# Patient Record
Sex: Male | Born: 1945 | Race: Black or African American | Hispanic: No | Marital: Married | Smoking: Former smoker
Health system: Southern US, Community
[De-identification: ages and names within clinical notes are randomized; demographics above are authoritative.]

## PROBLEM LIST (undated history)

## (undated) DIAGNOSIS — R011 Cardiac murmur, unspecified: Secondary | ICD-10-CM

## (undated) DIAGNOSIS — I509 Heart failure, unspecified: Secondary | ICD-10-CM

## (undated) DIAGNOSIS — Z5189 Encounter for other specified aftercare: Secondary | ICD-10-CM

## (undated) DIAGNOSIS — E079 Disorder of thyroid, unspecified: Secondary | ICD-10-CM

## (undated) DIAGNOSIS — IMO0002 Reserved for concepts with insufficient information to code with codable children: Secondary | ICD-10-CM

## (undated) DIAGNOSIS — Z860100 Personal history of colon polyps, unspecified: Secondary | ICD-10-CM

## (undated) DIAGNOSIS — J40 Bronchitis, not specified as acute or chronic: Secondary | ICD-10-CM

## (undated) DIAGNOSIS — R600 Localized edema: Secondary | ICD-10-CM

## (undated) DIAGNOSIS — Z940 Kidney transplant status: Secondary | ICD-10-CM

## (undated) DIAGNOSIS — N189 Chronic kidney disease, unspecified: Secondary | ICD-10-CM

## (undated) DIAGNOSIS — I2510 Atherosclerotic heart disease of native coronary artery without angina pectoris: Secondary | ICD-10-CM

## (undated) DIAGNOSIS — R609 Edema, unspecified: Secondary | ICD-10-CM

## (undated) DIAGNOSIS — I499 Cardiac arrhythmia, unspecified: Secondary | ICD-10-CM

## (undated) DIAGNOSIS — B958 Unspecified staphylococcus as the cause of diseases classified elsewhere: Secondary | ICD-10-CM

## (undated) DIAGNOSIS — M1990 Unspecified osteoarthritis, unspecified site: Secondary | ICD-10-CM

## (undated) DIAGNOSIS — Z8719 Personal history of other diseases of the digestive system: Secondary | ICD-10-CM

## (undated) DIAGNOSIS — R3915 Urgency of urination: Secondary | ICD-10-CM

## (undated) DIAGNOSIS — E669 Obesity, unspecified: Secondary | ICD-10-CM

## (undated) DIAGNOSIS — D649 Anemia, unspecified: Secondary | ICD-10-CM

## (undated) DIAGNOSIS — E785 Hyperlipidemia, unspecified: Secondary | ICD-10-CM

## (undated) DIAGNOSIS — R350 Frequency of micturition: Secondary | ICD-10-CM

## (undated) DIAGNOSIS — R351 Nocturia: Secondary | ICD-10-CM

## (undated) DIAGNOSIS — IMO0001 Reserved for inherently not codable concepts without codable children: Secondary | ICD-10-CM

## (undated) DIAGNOSIS — I10 Essential (primary) hypertension: Secondary | ICD-10-CM

## (undated) DIAGNOSIS — L853 Xerosis cutis: Secondary | ICD-10-CM

## (undated) DIAGNOSIS — K219 Gastro-esophageal reflux disease without esophagitis: Secondary | ICD-10-CM

## (undated) DIAGNOSIS — G4733 Obstructive sleep apnea (adult) (pediatric): Secondary | ICD-10-CM

## (undated) HISTORY — DX: Heart failure, unspecified: I50.9

## (undated) HISTORY — DX: Obstructive sleep apnea (adult) (pediatric): G47.33

## (undated) HISTORY — DX: Personal history of other diseases of the digestive system: Z87.19

## (undated) HISTORY — DX: Atherosclerotic heart disease of native coronary artery without angina pectoris: I25.10

## (undated) HISTORY — DX: Disorder of thyroid, unspecified: E07.9

## (undated) HISTORY — PX: COLONOSCOPY: SHX174

## (undated) HISTORY — DX: Hyperlipidemia, unspecified: E78.5

## (undated) HISTORY — PX: EYE SURGERY: SHX253

## (undated) HISTORY — PX: KIDNEY TRANSPLANT: SHX239

## (undated) HISTORY — DX: Anemia, unspecified: D64.9

## (undated) HISTORY — DX: Cardiac arrhythmia, unspecified: I49.9

## (undated) HISTORY — DX: Essential (primary) hypertension: I10

## (undated) HISTORY — PX: CARDIAC CATHETERIZATION: SHX172

## (undated) HISTORY — DX: Kidney transplant status: Z94.0

## (undated) HISTORY — DX: Reserved for concepts with insufficient information to code with codable children: IMO0002

## (undated) HISTORY — DX: Chronic kidney disease, unspecified: N18.9

## (undated) HISTORY — DX: Obesity, unspecified: E66.9

---

## 1998-12-01 ENCOUNTER — Encounter: Payer: Self-pay | Admitting: Internal Medicine

## 1998-12-01 ENCOUNTER — Inpatient Hospital Stay (HOSPITAL_COMMUNITY): Admission: AD | Admit: 1998-12-01 | Discharge: 1998-12-05 | Payer: Self-pay | Admitting: Internal Medicine

## 1998-12-02 ENCOUNTER — Encounter: Payer: Self-pay | Admitting: Internal Medicine

## 1998-12-10 ENCOUNTER — Encounter: Admission: RE | Admit: 1998-12-10 | Discharge: 1999-03-10 | Payer: Self-pay | Admitting: Internal Medicine

## 1998-12-24 ENCOUNTER — Ambulatory Visit (HOSPITAL_COMMUNITY): Admission: RE | Admit: 1998-12-24 | Discharge: 1998-12-24 | Payer: Self-pay | Admitting: Internal Medicine

## 1999-10-03 ENCOUNTER — Emergency Department (HOSPITAL_COMMUNITY): Admission: EM | Admit: 1999-10-03 | Discharge: 1999-10-03 | Payer: Self-pay | Admitting: Emergency Medicine

## 2001-01-17 ENCOUNTER — Ambulatory Visit (HOSPITAL_COMMUNITY): Admission: RE | Admit: 2001-01-17 | Discharge: 2001-01-17 | Payer: Self-pay | Admitting: Cardiology

## 2001-01-17 ENCOUNTER — Encounter: Payer: Self-pay | Admitting: Cardiology

## 2001-10-22 ENCOUNTER — Encounter: Payer: Self-pay | Admitting: Cardiology

## 2001-10-22 ENCOUNTER — Ambulatory Visit (HOSPITAL_COMMUNITY): Admission: RE | Admit: 2001-10-22 | Discharge: 2001-10-22 | Payer: Self-pay | Admitting: Cardiology

## 2002-03-19 ENCOUNTER — Ambulatory Visit (HOSPITAL_COMMUNITY): Admission: RE | Admit: 2002-03-19 | Discharge: 2002-03-19 | Payer: Self-pay | Admitting: Cardiology

## 2002-05-16 ENCOUNTER — Ambulatory Visit (HOSPITAL_BASED_OUTPATIENT_CLINIC_OR_DEPARTMENT_OTHER): Admission: RE | Admit: 2002-05-16 | Discharge: 2002-05-16 | Payer: Self-pay | Admitting: Pulmonary Disease

## 2002-08-07 ENCOUNTER — Ambulatory Visit (HOSPITAL_BASED_OUTPATIENT_CLINIC_OR_DEPARTMENT_OTHER): Admission: RE | Admit: 2002-08-07 | Discharge: 2002-08-07 | Payer: Self-pay | Admitting: Pulmonary Disease

## 2007-01-10 ENCOUNTER — Ambulatory Visit: Payer: Self-pay | Admitting: Pulmonary Disease

## 2009-11-18 ENCOUNTER — Encounter: Admission: RE | Admit: 2009-11-18 | Discharge: 2009-11-18 | Payer: Self-pay | Admitting: Nephrology

## 2009-12-04 ENCOUNTER — Ambulatory Visit (HOSPITAL_COMMUNITY): Admission: RE | Admit: 2009-12-04 | Discharge: 2009-12-04 | Payer: Self-pay | Admitting: Cardiology

## 2009-12-26 DIAGNOSIS — B958 Unspecified staphylococcus as the cause of diseases classified elsewhere: Secondary | ICD-10-CM

## 2009-12-26 HISTORY — DX: Unspecified staphylococcus as the cause of diseases classified elsewhere: B95.8

## 2010-10-26 ENCOUNTER — Emergency Department (HOSPITAL_COMMUNITY): Admission: EM | Admit: 2010-10-26 | Discharge: 2010-10-26 | Payer: Self-pay | Admitting: Emergency Medicine

## 2011-01-16 ENCOUNTER — Encounter: Payer: Self-pay | Admitting: Cardiology

## 2011-01-24 LAB — CBC
MCH: 25.3 pg — ABNORMAL LOW (ref 26.0–34.0)
MCH: 24.6 pg — ABNORMAL LOW (ref 26.0–34.0)
MCHC: 33.2 g/dL (ref 30.0–36.0)
MCV: 76.1 fL — ABNORMAL LOW (ref 78.0–100.0)
MCV: 76.8 fL — ABNORMAL LOW (ref 78.0–100.0)
Platelets: 188 10*3/uL (ref 150–400)
Platelets: 163 10*3/uL (ref 150–400)
RBC: 2.89 MIL/uL — ABNORMAL LOW (ref 4.22–5.81)
RBC: 2.84 MIL/uL — ABNORMAL LOW (ref 4.22–5.81)
RDW: 16 % — ABNORMAL HIGH (ref 11.5–15.5)
RDW: 16 % — ABNORMAL HIGH (ref 11.5–15.5)
WBC: 5.8 10*3/uL (ref 4.0–10.5)

## 2011-01-24 LAB — DIFFERENTIAL
Basophils Relative: 1 % (ref 0–1)
Basophils Relative: 1 % (ref 0–1)
Eosinophils Absolute: 0.1 10*3/uL (ref 0.0–0.7)
Eosinophils Absolute: 0.1 10*3/uL (ref 0.0–0.7)
Eosinophils Relative: 2 % (ref 0–5)
Eosinophils Relative: 2 % (ref 0–5)
Lymphs Abs: 0.8 10*3/uL (ref 0.7–4.0)
Lymphs Abs: 1.1 10*3/uL (ref 0.7–4.0)
Monocytes Absolute: 0.7 10*3/uL (ref 0.1–1.0)
Monocytes Relative: 12 % (ref 3–12)
Neutrophils Relative %: 73 % (ref 43–77)
Neutrophils Relative %: 70 % (ref 43–77)

## 2011-01-24 LAB — BASIC METABOLIC PANEL
BUN: 48 mg/dL — ABNORMAL HIGH (ref 6–23)
Calcium: 8.5 mg/dL (ref 8.4–10.5)
Creatinine, Ser: 4.21 mg/dL — ABNORMAL HIGH (ref 0.4–1.5)
GFR calc Af Amer: 17 mL/min — ABNORMAL LOW (ref >60)
GFR calc non Af Amer: 14 mL/min — ABNORMAL LOW (ref >60)

## 2011-01-24 LAB — BRAIN NATRIURETIC PEPTIDE: Pro B Natriuretic peptide (BNP): 298 pg/mL — ABNORMAL HIGH (ref 0.0–100.0)

## 2011-01-24 LAB — POCT CARDIAC MARKERS: Myoglobin, poc: 372 ng/mL (ref 12–200)

## 2011-01-24 LAB — GLUCOSE, CAPILLARY: Glucose-Capillary: 255 mg/dL — ABNORMAL HIGH (ref 70–99)

## 2011-01-25 ENCOUNTER — Inpatient Hospital Stay (HOSPITAL_COMMUNITY)
Admission: EM | Admit: 2011-01-25 | Discharge: 2011-01-31 | DRG: 683 | Disposition: A | Discharge status: Home / Self Care | Payer: MEDICARE | Attending: Cardiology | Admitting: Cardiology

## 2011-01-25 DIAGNOSIS — K222 Esophageal obstruction: Secondary | ICD-10-CM | POA: Diagnosis present

## 2011-01-25 DIAGNOSIS — Z79899 Other long term (current) drug therapy: Secondary | ICD-10-CM

## 2011-01-25 DIAGNOSIS — N184 Chronic kidney disease, stage 4 (severe): Secondary | ICD-10-CM | POA: Diagnosis present

## 2011-01-25 DIAGNOSIS — K449 Diaphragmatic hernia without obstruction or gangrene: Secondary | ICD-10-CM | POA: Diagnosis present

## 2011-01-25 DIAGNOSIS — E213 Hyperparathyroidism, unspecified: Secondary | ICD-10-CM | POA: Diagnosis present

## 2011-01-25 DIAGNOSIS — K648 Other hemorrhoids: Secondary | ICD-10-CM | POA: Diagnosis present

## 2011-01-25 DIAGNOSIS — E7800 Pure hypercholesterolemia, unspecified: Secondary | ICD-10-CM | POA: Diagnosis present

## 2011-01-25 DIAGNOSIS — Z794 Long term (current) use of insulin: Secondary | ICD-10-CM

## 2011-01-25 DIAGNOSIS — N179 Acute kidney failure, unspecified: Secondary | ICD-10-CM | POA: Diagnosis present

## 2011-01-25 DIAGNOSIS — K2980 Duodenitis without bleeding: Secondary | ICD-10-CM | POA: Diagnosis present

## 2011-01-25 DIAGNOSIS — E119 Type 2 diabetes mellitus without complications: Secondary | ICD-10-CM | POA: Diagnosis present

## 2011-01-25 DIAGNOSIS — R0789 Other chest pain: Secondary | ICD-10-CM | POA: Diagnosis present

## 2011-01-25 DIAGNOSIS — N289 Disorder of kidney and ureter, unspecified: Secondary | ICD-10-CM | POA: Diagnosis present

## 2011-01-25 DIAGNOSIS — D500 Iron deficiency anemia secondary to blood loss (chronic): Secondary | ICD-10-CM | POA: Diagnosis present

## 2011-01-25 DIAGNOSIS — R51 Headache: Secondary | ICD-10-CM | POA: Diagnosis not present

## 2011-01-25 DIAGNOSIS — Z8249 Family history of ischemic heart disease and other diseases of the circulatory system: Secondary | ICD-10-CM

## 2011-01-25 DIAGNOSIS — K644 Residual hemorrhoidal skin tags: Secondary | ICD-10-CM | POA: Diagnosis present

## 2011-01-25 DIAGNOSIS — I129 Hypertensive chronic kidney disease with stage 1 through stage 4 chronic kidney disease, or unspecified chronic kidney disease: Principal | ICD-10-CM | POA: Diagnosis present

## 2011-01-25 DIAGNOSIS — K259 Gastric ulcer, unspecified as acute or chronic, without hemorrhage or perforation: Secondary | ICD-10-CM | POA: Diagnosis present

## 2011-01-25 DIAGNOSIS — Z87891 Personal history of nicotine dependence: Secondary | ICD-10-CM

## 2011-01-25 DIAGNOSIS — D62 Acute posthemorrhagic anemia: Secondary | ICD-10-CM | POA: Diagnosis present

## 2011-01-25 DIAGNOSIS — I509 Heart failure, unspecified: Secondary | ICD-10-CM | POA: Diagnosis present

## 2011-01-25 DIAGNOSIS — E876 Hypokalemia: Secondary | ICD-10-CM | POA: Diagnosis not present

## 2011-01-25 DIAGNOSIS — I2510 Atherosclerotic heart disease of native coronary artery without angina pectoris: Secondary | ICD-10-CM | POA: Diagnosis present

## 2011-01-25 DIAGNOSIS — F411 Generalized anxiety disorder: Secondary | ICD-10-CM | POA: Diagnosis present

## 2011-01-25 LAB — IRON AND TIBC
Iron: 15 ug/dL — ABNORMAL LOW (ref 42–135)
Saturation Ratios: 6 % — ABNORMAL LOW (ref 20–55)
TIBC: 258 ug/dL (ref 215–435)

## 2011-01-25 LAB — COMPREHENSIVE METABOLIC PANEL
ALT: 31 U/L (ref 0–53)
AST: 31 U/L (ref 0–37)
Alkaline Phosphatase: 43 U/L (ref 39–117)
CO2: 23 mEq/L (ref 19–32)
Calcium: 8.2 mg/dL — ABNORMAL LOW (ref 8.4–10.5)
Chloride: 109 mEq/L (ref 96–112)
GFR calc Af Amer: 17 mL/min — ABNORMAL LOW (ref >60)
GFR calc non Af Amer: 14 mL/min — ABNORMAL LOW (ref >60)
Glucose, Bld: 151 mg/dL — ABNORMAL HIGH (ref 70–99)
Potassium: 4.4 mEq/L (ref 3.5–5.1)
Sodium: 138 mEq/L (ref 135–145)
Total Bilirubin: 0.7 mg/dL (ref 0.3–1.2)

## 2011-01-25 LAB — DIFFERENTIAL
Basophils Absolute: 0.1 10*3/uL (ref 0.0–0.1)
Eosinophils Relative: 3 % (ref 0–5)
Lymphs Abs: 1.1 10*3/uL (ref 0.7–4.0)
Monocytes Absolute: 0.6 10*3/uL (ref 0.1–1.0)
Monocytes Relative: 11 % (ref 3–12)
Neutrophils Relative %: 66 % (ref 43–77)

## 2011-01-25 LAB — LIPID PANEL
Cholesterol: 105 mg/dL (ref 0–200)
HDL: 41 mg/dL (ref >39)
LDL Cholesterol: 52 mg/dL (ref 0–99)
Total CHOL/HDL Ratio: 2.6 RATIO

## 2011-01-25 LAB — CARDIAC PANEL(CRET KIN+CKTOT+MB+TROPI)
CK, MB: 2.1 ng/mL (ref 0.3–4.0)
Relative Index: 1.1 (ref 0.0–2.5)
Relative Index: 0.9 (ref 0.0–2.5)
Total CK: 196 U/L (ref 7–232)
Troponin I: 0.04 ng/mL (ref 0.00–0.06)

## 2011-01-25 LAB — CBC
HCT: 25.8 % — ABNORMAL LOW (ref 39.0–52.0)
Hemoglobin: 8.7 g/dL — ABNORMAL LOW (ref 13.0–17.0)
MCHC: 33.7 g/dL (ref 30.0–36.0)
Platelets: 171 10*3/uL (ref 150–400)
RBC: 2.8 MIL/uL — ABNORMAL LOW (ref 4.22–5.81)
RBC: 3.34 MIL/uL — ABNORMAL LOW (ref 4.22–5.81)
RDW: 16 % — ABNORMAL HIGH (ref 11.5–15.5)
WBC: 5.6 10*3/uL (ref 4.0–10.5)

## 2011-01-25 LAB — FOLATE: Folate: 15.9 ng/mL

## 2011-01-25 LAB — BASIC METABOLIC PANEL
CO2: 27 mEq/L (ref 19–32)
Chloride: 107 mEq/L (ref 96–112)
GFR calc Af Amer: 17 mL/min — ABNORMAL LOW (ref >60)
Glucose, Bld: 163 mg/dL — ABNORMAL HIGH (ref 70–99)
Potassium: 4 mEq/L (ref 3.5–5.1)
Sodium: 139 mEq/L (ref 135–145)

## 2011-01-25 LAB — PROTIME-INR: INR: 1.16 (ref 0.00–1.49)

## 2011-01-25 LAB — FERRITIN: Ferritin: 61 ng/mL (ref 22–322)

## 2011-01-25 LAB — BRAIN NATRIURETIC PEPTIDE: Pro B Natriuretic peptide (BNP): 305 pg/mL — ABNORMAL HIGH (ref 0.0–100.0)

## 2011-01-25 LAB — MAGNESIUM: Magnesium: 2.6 mg/dL — ABNORMAL HIGH (ref 1.5–2.5)

## 2011-01-25 LAB — GLUCOSE, CAPILLARY
Glucose-Capillary: 150 mg/dL — ABNORMAL HIGH (ref 70–99)
Glucose-Capillary: 177 mg/dL — ABNORMAL HIGH (ref 70–99)

## 2011-01-25 LAB — TROPONIN I: Troponin I: 0.05 ng/mL (ref 0.00–0.06)

## 2011-01-25 LAB — TSH: TSH: 2.45 u[IU]/mL (ref 0.350–4.500)

## 2011-01-25 LAB — APTT: aPTT: 44 seconds — ABNORMAL HIGH (ref 24–37)

## 2011-01-25 LAB — ABO/RH: ABO/RH(D): A POS

## 2011-01-26 LAB — GLUCOSE, CAPILLARY
Glucose-Capillary: 74 mg/dL (ref 70–99)
Glucose-Capillary: 78 mg/dL (ref 70–99)
Glucose-Capillary: 122 mg/dL — ABNORMAL HIGH (ref 70–99)

## 2011-01-26 LAB — TYPE AND SCREEN: Unit division: 0

## 2011-01-26 LAB — CBC
Hemoglobin: 8.7 g/dL — ABNORMAL LOW (ref 13.0–17.0)
MCH: 25.7 pg — ABNORMAL LOW (ref 26.0–34.0)
Platelets: 182 10*3/uL (ref 150–400)
RBC: 3.39 MIL/uL — ABNORMAL LOW (ref 4.22–5.81)
WBC: 9 10*3/uL (ref 4.0–10.5)

## 2011-01-26 LAB — BASIC METABOLIC PANEL
Chloride: 101 mEq/L (ref 96–112)
Creatinine, Ser: 4.43 mg/dL — ABNORMAL HIGH (ref 0.4–1.5)
GFR calc Af Amer: 16 mL/min — ABNORMAL LOW (ref >60)
Sodium: 139 mEq/L (ref 135–145)

## 2011-01-26 LAB — BRAIN NATRIURETIC PEPTIDE: Pro B Natriuretic peptide (BNP): 231 pg/mL — ABNORMAL HIGH (ref 0.0–100.0)

## 2011-01-27 LAB — RENAL FUNCTION PANEL
BUN: 34 mg/dL — ABNORMAL HIGH (ref 6–23)
Calcium: 7.7 mg/dL — ABNORMAL LOW (ref 8.4–10.5)
Glucose, Bld: 105 mg/dL — ABNORMAL HIGH (ref 70–99)
Phosphorus: 4.6 mg/dL (ref 2.3–4.6)

## 2011-01-27 LAB — CBC
HCT: 25 % — ABNORMAL LOW (ref 39.0–52.0)
MCHC: 32.8 g/dL (ref 30.0–36.0)
MCV: 76.9 fL — ABNORMAL LOW (ref 78.0–100.0)
Platelets: 189 10*3/uL (ref 150–400)
RDW: 16 % — ABNORMAL HIGH (ref 11.5–15.5)

## 2011-01-27 LAB — GLUCOSE, CAPILLARY
Glucose-Capillary: 143 mg/dL — ABNORMAL HIGH (ref 70–99)
Glucose-Capillary: 110 mg/dL — ABNORMAL HIGH (ref 70–99)

## 2011-01-27 LAB — PTH, INTACT AND CALCIUM: Calcium, Total (PTH): 8 mg/dL — ABNORMAL LOW (ref 8.4–10.5)

## 2011-01-28 LAB — RENAL FUNCTION PANEL
Albumin: 2.7 g/dL — ABNORMAL LOW (ref 3.5–5.2)
BUN: 34 mg/dL — ABNORMAL HIGH (ref 6–23)
Chloride: 102 mEq/L (ref 96–112)
Creatinine, Ser: 4.08 mg/dL — ABNORMAL HIGH (ref 0.4–1.5)
Phosphorus: 4.2 mg/dL (ref 2.3–4.6)

## 2011-01-28 LAB — CBC
MCH: 25.4 pg — ABNORMAL LOW (ref 26.0–34.0)
MCHC: 32.6 g/dL (ref 30.0–36.0)
MCV: 78.1 fL (ref 78.0–100.0)
Platelets: 193 10*3/uL (ref 150–400)
RDW: 16.1 % — ABNORMAL HIGH (ref 11.5–15.5)

## 2011-01-28 LAB — GLUCOSE, CAPILLARY: Glucose-Capillary: 161 mg/dL — ABNORMAL HIGH (ref 70–99)

## 2011-01-28 LAB — BASIC METABOLIC PANEL
BUN: 34 mg/dL — ABNORMAL HIGH (ref 6–23)
GFR calc non Af Amer: 14 mL/min — ABNORMAL LOW (ref >60)
Glucose, Bld: 137 mg/dL — ABNORMAL HIGH (ref 70–99)
Potassium: 3.5 mEq/L (ref 3.5–5.1)

## 2011-01-29 LAB — CBC
MCH: 25.3 pg — ABNORMAL LOW (ref 26.0–34.0)
MCV: 77.7 fL — ABNORMAL LOW (ref 78.0–100.0)
Platelets: 198 10*3/uL (ref 150–400)
RDW: 16.2 % — ABNORMAL HIGH (ref 11.5–15.5)
WBC: 7.9 10*3/uL (ref 4.0–10.5)

## 2011-01-29 LAB — GLUCOSE, CAPILLARY: Glucose-Capillary: 189 mg/dL — ABNORMAL HIGH (ref 70–99)

## 2011-01-29 LAB — BASIC METABOLIC PANEL
BUN: 37 mg/dL — ABNORMAL HIGH (ref 6–23)
Calcium: 8.2 mg/dL — ABNORMAL LOW (ref 8.4–10.5)
Creatinine, Ser: 4.32 mg/dL — ABNORMAL HIGH (ref 0.4–1.5)
GFR calc non Af Amer: 14 mL/min — ABNORMAL LOW (ref >60)
Potassium: 3.9 mEq/L (ref 3.5–5.1)

## 2011-01-29 LAB — MAGNESIUM: Magnesium: 2.1 mg/dL (ref 1.5–2.5)

## 2011-01-30 LAB — RENAL FUNCTION PANEL
Albumin: 2.8 g/dL — ABNORMAL LOW (ref 3.5–5.2)
BUN: 38 mg/dL — ABNORMAL HIGH (ref 6–23)
CO2: 28 mEq/L (ref 19–32)
Chloride: 102 mEq/L (ref 96–112)
Creatinine, Ser: 4.16 mg/dL — ABNORMAL HIGH (ref 0.4–1.5)
Glucose, Bld: 154 mg/dL — ABNORMAL HIGH (ref 70–99)

## 2011-01-30 LAB — GLUCOSE, CAPILLARY
Glucose-Capillary: 212 mg/dL — ABNORMAL HIGH (ref 70–99)
Glucose-Capillary: 181 mg/dL — ABNORMAL HIGH (ref 70–99)

## 2011-01-31 LAB — BASIC METABOLIC PANEL
CO2: 28 mEq/L (ref 19–32)
Chloride: 101 mEq/L (ref 96–112)
GFR calc Af Amer: 17 mL/min — ABNORMAL LOW (ref >60)
Glucose, Bld: 162 mg/dL — ABNORMAL HIGH (ref 70–99)
Potassium: 4 mEq/L (ref 3.5–5.1)
Sodium: 139 mEq/L (ref 135–145)

## 2011-01-31 LAB — GLUCOSE, CAPILLARY: Glucose-Capillary: 209 mg/dL — ABNORMAL HIGH (ref 70–99)

## 2011-01-31 NOTE — Consult Note (Signed)
Cameron [name], Cameron [name]                ACCOUNT NO.:  [id number]  MEDICAL RECORD NO.:  [id number]          PATIENT TYPE:  INP  LOCATION:  1231                         FACILITY:  [facility]  PHYSICIAN:  Jordan Hawks. Elnoria Howard, MD    DATE OF BIRTH:  September 07, 1946  DATE OF CONSULTATION:  01/25/2011 DATE OF DISCHARGE:                                CONSULTATION   REASON FOR CONSULTATION:  Iron deficiency anemia.  HISTORY OF PRESENT ILLNESS:  This is a 65 year old gentleman with a past medical history of malignant hypertension with some compliance issues, diabetes and chronic renal insufficiency, was admitted to the hospital with complaints of shortness of breath.  The patient was noted to be in mild CHF.  He also had complaints of left lower extremity edema.  His systolic blood pressure was greater than 210 and it is clear there was a volume overloaded.  His creatinine was also noted to be elevated at 4.21, and previously on May 02, 2010 it was at 3.67.  Further evaluation of his blood work also revealed that his hemoglobin was dropped to a low 6.9.  In the past, it was apparently in the 10 range.  The patient denies having any problems with the abdominal pain, nausea, vomiting, hematemesis, melena, or hematochezia.  No complaints of diarrhea or constipation.  There is no history of NSAID use and no family history of colon cancer.  He reports having colonoscopy number of years ago.  He thinks it was approximately 5 years year for routine purposes and was negative for any pathology at that time.  Because of the iron deficiency anemia, GI consultation was requested for further evaluation and treatment.  PAST MEDICAL AND SURGICAL HISTORY:  As stated above.  FAMILY HISTORY:  Noncontributory.  SOCIAL HISTORY:  Negative for alcohol, tobacco, or illicit drug use. The patient is a Optician, dispensing.  REVIEW OF SYSTEMS:  As stated above in history present illness, otherwise negative.  PHYSICAL EXAMINATION:  VITAL  SIGNS:  Blood pressure is in the 150s over 60s, heart rate in the 60s. GENERAL:  The patient is in no acute distress, alert and oriented. HEENT:  Normocephalic, atraumatic.  Extraocular muscles intact. NECK:  Supple.  No lymphadenopathy. LUNGS:  Clear to auscultation bilaterally. CARDIOVASCULAR:  Regular rate and rhythm. ABDOMEN:  Obese, soft, nontender, nondistended.  Positive bowel sounds. EXTREMITIES:  No clubbing, cyanosis, or edema.  LABORATORY VALUES:  White blood cell count 7.3, hemoglobin 8.7, MCV 77.2, platelets at 175,000.  Sodium 139, potassium is 4.0, chloride 107, CO2 27, glucose 163, BUN 42, creatinine is 4.1.  Hemoccult is then negative.  BNP is 305.  IMPRESSION: 1. Iron deficiency anemia. 2. Chronic renal insufficiency. 3. Malignant hypertension. 4. Congestive heart failure.  It is clear to me that the patient's anemia is secondary to his renal insufficiency.  However, a GI source also need to be excluded at this time.  His colonoscopy was approximately 5 years ago.  I do not have any reports of that for any verification regardless, repeat evaluation is warranted at this time.  There is no imminent need for the procedure as he is  currently stable and I would like him to be tuned up just a bit more before he undergoes the EGD and colonoscopy.  Plan is for EGD and colonoscopy on January 27, 2011.  Further recommendations pending the findings.     Jordan Hawks Elnoria Howard, MD     PDH/MEDQ  D:  01/25/2011  T:  01/25/2011  Job:  [id number]  cc:   Eduardo Osier. Sharyn Lull, M.D. Fax: [phone]  Mindi Slicker. Lowell Guitar, M.D. Fax: [phone]  Electronically Signed by Jeani Hawking MD on 01/31/2011 04:15:52 PM
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
## 2011-02-16 NOTE — Consult Note (Signed)
Cameron [name], Cameron [name]                ACCOUNT NO.:  [id number]  MEDICAL RECORD NO.:  [id number]          PATIENT TYPE:  INP  LOCATION:  1231                         FACILITY:  [facility]  PHYSICIAN:  Cameron [name], M.D.DATE OF BIRTH:  April 22, 1946  DATE OF CONSULTATION: DATE OF DISCHARGE:                                CONSULTATION   REQUESTING PHYSICIAN:  Cameron [name], M.D.  REASON FOR CONSULTATION:  Acute-on-chronic renal failure and hypertensive urgency.  HISTORY OF PRESENT ILLNESS:  [name] is a 65 year old black male with a past medical history significant for diabetes mellitus, malignant hypertension with possibility of noncompliance with blood pressure medications as well as CKD.  Creatinine was last noted to be in the low 2s when he saw Dr. Lowell Guitar at [facility] in May 2011. Labs recently came across on the patient with a creatinine of 3.67 and that was on January 18, 2011.  The patient was noted to be admitted on January 30 for shortness of breath, hypertensive urgency with as SBP of greater than 210 with noted CHF/volume overload.  Creatinine was 4.21 on admit and 4.27 today.  We are asked to see.  The patient said he had been getting gradually more short of breath with dyspnea on exertion and worsening edema and decreased urine output.  However, he denies any appetite disturbance or nausea/ vomiting.  Of note also his hemoglobin was in the 6s with a heme negative stool, and he denies overt bleeding. The patient states that he feels better since admission.  His blood pressure has been lowered to the 160s to 180s, but Is and Os have not been recorded, so I am sure how much volume has been relieved.  The patient did have a CT scan, which did not show any hydronephrosis.  PAST MEDICAL HISTORY: 1. Diabetes mellitus. 2. Hypertension, malignant 3. Hyperlipidemia. 4. Obesity. 5. Mild coronary artery disease. 6. Obstructive sleep apnea. 7. Stage 3  CKD, last year now appears to be progressing  CURRENT MEDICATIONS: 1. Norvasc 5 mg b.i.d. 2. Coreg 25 mg b.i.d. 3. Clonidine 0.2 mg t.i.d. 4. Iron b.i.d. 5. Lasix 80 mg IV b.i.d. 6. Insulin. 7. BiDil t.i.d. 8. Protonix 40 mg daily.  ALLERGIES:  SHELLFISH.  SOCIAL HISTORY:  Denies tobacco, alcohol or drugs.  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  FAMILY HISTORY:  Family history is negative for renal disease.  REVIEW OF SYSTEMS:  Positive for shortness of breath, dyspnea on exertion, edema and decreased urine output. Negative for nosebleed, rectal bleed, decreased appetite, abdominal pain, nausea, vomiting, rash, fevers or chills.  Otherwise review of systems is negative.  PHYSICAL EXAMINATION:  The patient is afebrile.  Blood pressure 181/71, heart rate 67, respirations 19, oxygen saturation is adequate on nasal cannula oxygen.  No Is and Os are recorded.  Weight initially was 99.7 kg where his usual weight is 210 pounds. GENERAL:  The patient is alert and says feels better than upon admission. HEENT:  Pupils are equal, round, react to light, extraocular motions intact, mucous membranes are moist.  NECK:  There is jugular venous distention. LUNGS:  Mostly  clear bilaterally. CARDIOVASCULAR:  Regular rate and rhythm. ABDOMEN:  Soft, nontender, and nondistended. EXTREMITIES:  Dependent edema with pitting at the thighs.  LABORATORY DATA:  White blood count 7.3, hemoglobin 8.7 after transfusion.  Iron saturation is 6.  Occult blood test is negative.  TSH within normal limits.  Potassium 4.4, BUN and creatinine 45 and 4.27 with a glucose of 151.  Urinalysis shows greater than 300 of protein and moderate blood but with rare cells.  Abdominal CT showed no hydronephrosis, and albumin is 3.0.  ASSESSMENT:  A 65 year old, black male with progressive renal insufficiency in the setting of malignant hypertension. 1. Renal:  Function has been worsening as an outpatient now much  worse     than where it has been.  Glomerular filtration rate has gone from     35 to the 15-20 range.  I informed the patient of what that means.     Urinalysis does not indicate acute process.  It is most likely     secondary to longstanding hypertension and now decompensated     congestive heart failure. 2. Blood pressure/volume:  I agree with Lasix 80 mg IV b.i.d. for now.     Would like to get accurate Is and Os so can monitor.  We need to be     careful about getting blood pressure low too quickly.  I would     actually aim for systolics in the 160s to 170s right now. 3. Anemia with no obvious blood loss, possibly anemia just secondary     to chronic kidney disease. Will give iron and start Aranesp. 4. I have informed the patient about what the future most likely we     will hold.  He is interested in PED.  Thank you for consultation.  Will follow with you.          ______________________________ Cameron [name], M.D.     KAG/MEDQ  D:  01/25/2011  T:  01/25/2011  Job:  [id number]  Electronically Signed by Annie Sable M.D. on 02/16/2011 08:08:39 PM

## 2011-02-21 ENCOUNTER — Encounter (HOSPITAL_COMMUNITY): Payer: MEDICARE | Attending: Nephrology

## 2011-02-21 DIAGNOSIS — N183 Chronic kidney disease, stage 3 unspecified: Secondary | ICD-10-CM | POA: Insufficient documentation

## 2011-02-21 DIAGNOSIS — D638 Anemia in other chronic diseases classified elsewhere: Secondary | ICD-10-CM | POA: Insufficient documentation

## 2011-03-07 ENCOUNTER — Other Ambulatory Visit: Payer: Self-pay | Admitting: Nephrology

## 2011-03-07 ENCOUNTER — Other Ambulatory Visit: Payer: Self-pay

## 2011-03-07 ENCOUNTER — Encounter (HOSPITAL_COMMUNITY): Payer: MEDICARE | Attending: Nephrology

## 2011-03-07 DIAGNOSIS — D638 Anemia in other chronic diseases classified elsewhere: Secondary | ICD-10-CM | POA: Insufficient documentation

## 2011-03-07 DIAGNOSIS — N183 Chronic kidney disease, stage 3 unspecified: Secondary | ICD-10-CM | POA: Insufficient documentation

## 2011-03-07 LAB — RENAL FUNCTION PANEL
Albumin: 3.9 g/dL (ref 3.5–5.2)
BUN: 46 mg/dL — ABNORMAL HIGH (ref 6–23)
CO2: 29 mEq/L (ref 19–32)
Calcium: 9 mg/dL (ref 8.4–10.5)
Creatinine, Ser: 3.35 mg/dL — ABNORMAL HIGH (ref 0.4–1.5)
GFR calc Af Amer: 22 mL/min — ABNORMAL LOW (ref >60)
GFR calc non Af Amer: 19 mL/min — ABNORMAL LOW (ref >60)

## 2011-03-07 LAB — IRON AND TIBC
Saturation Ratios: 23 % (ref 20–55)
UIBC: 190 ug/dL

## 2011-03-08 LAB — BASIC METABOLIC PANEL
BUN: 26 mg/dL — ABNORMAL HIGH (ref 6–23)
Chloride: 93 mEq/L — ABNORMAL LOW (ref 96–112)
GFR calc Af Amer: 25 mL/min — ABNORMAL LOW (ref >60)
GFR calc non Af Amer: 21 mL/min — ABNORMAL LOW (ref >60)
Potassium: 3 mEq/L — ABNORMAL LOW (ref 3.5–5.1)
Sodium: 135 mEq/L (ref 135–145)

## 2011-03-08 LAB — URINE MICROSCOPIC-ADD ON

## 2011-03-08 LAB — DIFFERENTIAL
Basophils Absolute: 0 10*3/uL (ref 0.0–0.1)
Basophils Relative: 0 % (ref 0–1)
Eosinophils Absolute: 0.3 10*3/uL (ref 0.0–0.7)
Eosinophils Relative: 4 % (ref 0–5)
Lymphocytes Relative: 16 % (ref 12–46)
Lymphs Abs: 1.1 10*3/uL (ref 0.7–4.0)
Monocytes Absolute: 0.8 10*3/uL (ref 0.1–1.0)
Monocytes Relative: 11 % (ref 3–12)
Neutro Abs: 4.8 10*3/uL (ref 1.7–7.7)
Neutrophils Relative %: 68 % (ref 43–77)

## 2011-03-08 LAB — CBC
HCT: 30.3 % — ABNORMAL LOW (ref 39.0–52.0)
Hemoglobin: 10.2 g/dL — ABNORMAL LOW (ref 13.0–17.0)
MCH: 26.6 pg (ref 26.0–34.0)
MCHC: 33.6 g/dL (ref 30.0–36.0)
MCV: 79.1 fL (ref 78.0–100.0)
Platelets: 214 10*3/uL (ref 150–400)
RBC: 3.83 MIL/uL — ABNORMAL LOW (ref 4.22–5.81)
RDW: 14.8 % (ref 11.5–15.5)
WBC: 7.1 10*3/uL (ref 4.0–10.5)

## 2011-03-08 LAB — PTH, INTACT AND CALCIUM
Calcium, Total (PTH): 8.9 mg/dL (ref 8.4–10.5)
PTH: 226.5 pg/mL — ABNORMAL HIGH (ref 14.0–72.0)

## 2011-03-08 LAB — URINALYSIS, ROUTINE W REFLEX MICROSCOPIC
Bilirubin Urine: NEGATIVE
Nitrite: NEGATIVE
Protein, ur: >300 mg/dL — AB
Specific Gravity, Urine: 1.014 (ref 1.005–1.030)
Urobilinogen, UA: 0.2 mg/dL (ref 0.0–1.0)

## 2011-03-08 LAB — POCT HEMOGLOBIN-HEMACUE: Hemoglobin: 11.2 g/dL — ABNORMAL LOW (ref 13.0–17.0)

## 2011-03-22 ENCOUNTER — Encounter (HOSPITAL_COMMUNITY): Payer: MEDICARE

## 2011-03-31 ENCOUNTER — Encounter (HOSPITAL_COMMUNITY): Payer: MEDICARE | Attending: Nephrology

## 2011-03-31 DIAGNOSIS — N183 Chronic kidney disease, stage 3 unspecified: Secondary | ICD-10-CM | POA: Insufficient documentation

## 2011-03-31 DIAGNOSIS — D638 Anemia in other chronic diseases classified elsewhere: Secondary | ICD-10-CM | POA: Insufficient documentation

## 2011-04-08 ENCOUNTER — Encounter (HOSPITAL_COMMUNITY): Payer: MEDICARE

## 2011-04-08 ENCOUNTER — Other Ambulatory Visit: Payer: Self-pay | Admitting: Nephrology

## 2011-04-22 ENCOUNTER — Encounter (HOSPITAL_COMMUNITY): Payer: MEDICARE

## 2011-04-25 ENCOUNTER — Encounter (HOSPITAL_COMMUNITY): Payer: MEDICARE
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
## 2011-06-06 ENCOUNTER — Emergency Department (HOSPITAL_COMMUNITY)
Admission: EM | Admit: 2011-06-06 | Discharge: 2011-06-06 | Disposition: A | Discharge status: Home / Self Care | Payer: Medicare Other | Attending: Emergency Medicine | Admitting: Emergency Medicine

## 2011-06-06 ENCOUNTER — Emergency Department (HOSPITAL_COMMUNITY): Payer: Medicare Other

## 2011-06-06 DIAGNOSIS — R5383 Other fatigue: Secondary | ICD-10-CM | POA: Insufficient documentation

## 2011-06-06 DIAGNOSIS — I10 Essential (primary) hypertension: Secondary | ICD-10-CM | POA: Insufficient documentation

## 2011-06-06 DIAGNOSIS — Z79899 Other long term (current) drug therapy: Secondary | ICD-10-CM | POA: Insufficient documentation

## 2011-06-06 DIAGNOSIS — E785 Hyperlipidemia, unspecified: Secondary | ICD-10-CM | POA: Insufficient documentation

## 2011-06-06 DIAGNOSIS — R0602 Shortness of breath: Secondary | ICD-10-CM | POA: Insufficient documentation

## 2011-06-06 DIAGNOSIS — R5381 Other malaise: Secondary | ICD-10-CM | POA: Insufficient documentation

## 2011-06-06 DIAGNOSIS — Z7982 Long term (current) use of aspirin: Secondary | ICD-10-CM | POA: Insufficient documentation

## 2011-06-06 DIAGNOSIS — E119 Type 2 diabetes mellitus without complications: Secondary | ICD-10-CM | POA: Insufficient documentation

## 2011-06-06 LAB — PRO B NATRIURETIC PEPTIDE: Pro B Natriuretic peptide (BNP): 5538 pg/mL — ABNORMAL HIGH (ref 0–125)

## 2011-06-06 LAB — CK TOTAL AND CKMB (NOT AT ARMC)
CK, MB: 2.5 ng/mL (ref 0.3–4.0)
Relative Index: 1 (ref 0.0–2.5)

## 2011-06-06 LAB — COMPREHENSIVE METABOLIC PANEL
ALT: 18 U/L (ref 0–53)
AST: 19 U/L (ref 0–37)
Albumin: 3.6 g/dL (ref 3.5–5.2)
Alkaline Phosphatase: 47 U/L (ref 39–117)
Calcium: 9.2 mg/dL (ref 8.4–10.5)
GFR calc Af Amer: 20 mL/min — ABNORMAL LOW (ref >60)
Glucose, Bld: 387 mg/dL — ABNORMAL HIGH (ref 70–99)
Potassium: 3.3 mEq/L — ABNORMAL LOW (ref 3.5–5.1)
Sodium: 136 mEq/L (ref 135–145)
Total Protein: 7.6 g/dL (ref 6.0–8.3)

## 2011-06-06 LAB — URINALYSIS, ROUTINE W REFLEX MICROSCOPIC
Bilirubin Urine: NEGATIVE
Leukocytes, UA: NEGATIVE
Nitrite: NEGATIVE
Specific Gravity, Urine: 1.017 (ref 1.005–1.030)
Urobilinogen, UA: 0.2 mg/dL (ref 0.0–1.0)

## 2011-06-06 LAB — DIFFERENTIAL
Basophils Absolute: 0 10*3/uL (ref 0.0–0.1)
Eosinophils Relative: 2 % (ref 0–5)
Lymphocytes Relative: 19 % (ref 12–46)
Lymphs Abs: 0.9 10*3/uL (ref 0.7–4.0)
Monocytes Absolute: 0.7 10*3/uL (ref 0.1–1.0)
Neutro Abs: 3 10*3/uL (ref 1.7–7.7)

## 2011-06-06 LAB — CBC
HCT: 33 % — ABNORMAL LOW (ref 39.0–52.0)
Hemoglobin: 11.1 g/dL — ABNORMAL LOW (ref 13.0–17.0)
MCHC: 33.6 g/dL (ref 30.0–36.0)
MCV: 76.6 fL — ABNORMAL LOW (ref 78.0–100.0)
WBC: 4.7 10*3/uL (ref 4.0–10.5)

## 2011-06-06 LAB — GLUCOSE, CAPILLARY

## 2011-06-06 LAB — URINE MICROSCOPIC-ADD ON

## 2011-06-22 ENCOUNTER — Other Ambulatory Visit: Payer: Self-pay | Admitting: Nephrology

## 2011-06-22 ENCOUNTER — Encounter (HOSPITAL_COMMUNITY): Payer: Medicare Other | Attending: Nephrology

## 2011-06-22 DIAGNOSIS — N183 Chronic kidney disease, stage 3 unspecified: Secondary | ICD-10-CM | POA: Insufficient documentation

## 2011-06-22 DIAGNOSIS — D638 Anemia in other chronic diseases classified elsewhere: Secondary | ICD-10-CM | POA: Insufficient documentation

## 2011-06-22 LAB — IRON AND TIBC
Iron: 43 ug/dL (ref 42–135)
Saturation Ratios: 18 % — ABNORMAL LOW (ref 20–55)
UIBC: 196 ug/dL

## 2011-06-22 LAB — RENAL FUNCTION PANEL
BUN: 43 mg/dL — ABNORMAL HIGH (ref 6–23)
Chloride: 99 mEq/L (ref 96–112)
Creatinine, Ser: 4.33 mg/dL — ABNORMAL HIGH (ref 0.50–1.35)
Glucose, Bld: 163 mg/dL — ABNORMAL HIGH (ref 70–99)
Phosphorus: 3.7 mg/dL (ref 2.3–4.6)
Potassium: 3.9 mEq/L (ref 3.5–5.1)

## 2011-06-22 LAB — POCT HEMOGLOBIN-HEMACUE: Hemoglobin: 8.4 g/dL — ABNORMAL LOW (ref 13.0–17.0)

## 2011-07-04 ENCOUNTER — Other Ambulatory Visit: Payer: Self-pay | Admitting: Nephrology

## 2011-07-04 ENCOUNTER — Encounter (HOSPITAL_COMMUNITY): Payer: Medicare Other | Attending: Nephrology

## 2011-07-04 DIAGNOSIS — D638 Anemia in other chronic diseases classified elsewhere: Secondary | ICD-10-CM | POA: Insufficient documentation

## 2011-07-04 DIAGNOSIS — N183 Chronic kidney disease, stage 3 unspecified: Secondary | ICD-10-CM | POA: Insufficient documentation

## 2011-07-20 ENCOUNTER — Encounter (HOSPITAL_COMMUNITY): Payer: Medicare Other

## 2011-07-20 ENCOUNTER — Other Ambulatory Visit: Payer: Self-pay | Admitting: Nephrology

## 2011-08-03 ENCOUNTER — Encounter (HOSPITAL_COMMUNITY): Payer: Medicare Other | Attending: Nephrology

## 2011-08-03 ENCOUNTER — Other Ambulatory Visit: Payer: Self-pay | Admitting: Nephrology

## 2011-08-03 DIAGNOSIS — D638 Anemia in other chronic diseases classified elsewhere: Secondary | ICD-10-CM | POA: Insufficient documentation

## 2011-08-03 DIAGNOSIS — N183 Chronic kidney disease, stage 3 unspecified: Secondary | ICD-10-CM | POA: Insufficient documentation

## 2011-08-13 ENCOUNTER — Emergency Department (HOSPITAL_COMMUNITY): Payer: Medicare Other

## 2011-08-13 ENCOUNTER — Emergency Department (HOSPITAL_COMMUNITY)
Admission: EM | Admit: 2011-08-13 | Discharge: 2011-08-13 | Disposition: A | Discharge status: Home / Self Care | Payer: Medicare Other | Attending: Emergency Medicine | Admitting: Emergency Medicine

## 2011-08-13 DIAGNOSIS — D649 Anemia, unspecified: Secondary | ICD-10-CM | POA: Insufficient documentation

## 2011-08-13 DIAGNOSIS — W19XXXA Unspecified fall, initial encounter: Secondary | ICD-10-CM | POA: Insufficient documentation

## 2011-08-13 DIAGNOSIS — I10 Essential (primary) hypertension: Secondary | ICD-10-CM | POA: Insufficient documentation

## 2011-08-13 DIAGNOSIS — S7000XA Contusion of unspecified hip, initial encounter: Secondary | ICD-10-CM | POA: Insufficient documentation

## 2011-08-13 DIAGNOSIS — Y92009 Unspecified place in unspecified non-institutional (private) residence as the place of occurrence of the external cause: Secondary | ICD-10-CM | POA: Insufficient documentation

## 2011-08-13 DIAGNOSIS — E119 Type 2 diabetes mellitus without complications: Secondary | ICD-10-CM | POA: Insufficient documentation

## 2011-08-13 DIAGNOSIS — N289 Disorder of kidney and ureter, unspecified: Secondary | ICD-10-CM | POA: Insufficient documentation

## 2011-08-13 LAB — CBC
HCT: 24.6 % — ABNORMAL LOW (ref 39.0–52.0)
Hemoglobin: 8.2 g/dL — ABNORMAL LOW (ref 13.0–17.0)
MCH: 26.5 pg (ref 26.0–34.0)
MCV: 79.4 fL (ref 78.0–100.0)
RBC: 3.1 MIL/uL — ABNORMAL LOW (ref 4.22–5.81)

## 2011-08-13 LAB — BASIC METABOLIC PANEL
CO2: 30 mEq/L (ref 19–32)
GFR calc non Af Amer: 13 mL/min — ABNORMAL LOW (ref >60)
Glucose, Bld: 165 mg/dL — ABNORMAL HIGH (ref 70–99)
Potassium: 3.2 mEq/L — ABNORMAL LOW (ref 3.5–5.1)
Sodium: 140 mEq/L (ref 135–145)

## 2011-08-13 LAB — DIFFERENTIAL
Lymphs Abs: 0.8 10*3/uL (ref 0.7–4.0)
Monocytes Relative: 11 % (ref 3–12)
Neutro Abs: 4.7 10*3/uL (ref 1.7–7.7)
Neutrophils Relative %: 74 % (ref 43–77)

## 2011-08-13 LAB — PROTIME-INR: Prothrombin Time: 15.3 seconds — ABNORMAL HIGH (ref 11.6–15.2)

## 2011-08-17 ENCOUNTER — Other Ambulatory Visit: Payer: Self-pay | Admitting: Nephrology

## 2011-08-17 ENCOUNTER — Encounter (HOSPITAL_COMMUNITY): Payer: Medicare Other

## 2011-08-31 ENCOUNTER — Encounter (HOSPITAL_COMMUNITY): Payer: Medicare Other

## 2011-09-06 ENCOUNTER — Other Ambulatory Visit: Payer: Self-pay | Admitting: Nephrology

## 2011-09-06 ENCOUNTER — Encounter (HOSPITAL_COMMUNITY): Payer: Medicare Other | Attending: Nephrology

## 2011-09-06 DIAGNOSIS — N183 Chronic kidney disease, stage 3 unspecified: Secondary | ICD-10-CM | POA: Insufficient documentation

## 2011-09-06 DIAGNOSIS — D638 Anemia in other chronic diseases classified elsewhere: Secondary | ICD-10-CM | POA: Insufficient documentation

## 2011-09-06 LAB — POCT HEMOGLOBIN-HEMACUE: Hemoglobin: 7.5 g/dL — ABNORMAL LOW (ref 13.0–17.0)

## 2011-09-07 ENCOUNTER — Encounter (HOSPITAL_COMMUNITY): Payer: Medicare Other

## 2011-09-13 ENCOUNTER — Encounter (HOSPITAL_COMMUNITY): Payer: Medicare Other

## 2011-09-13 ENCOUNTER — Other Ambulatory Visit: Payer: Self-pay | Admitting: Nephrology

## 2011-09-13 LAB — POCT HEMOGLOBIN-HEMACUE: Hemoglobin: 8 g/dL — ABNORMAL LOW (ref 13.0–17.0)

## 2011-09-20 ENCOUNTER — Encounter (HOSPITAL_COMMUNITY): Payer: Medicare Other

## 2011-09-20 ENCOUNTER — Other Ambulatory Visit: Payer: Self-pay | Admitting: Nephrology

## 2011-09-20 LAB — RENAL FUNCTION PANEL
Albumin: 3.5 g/dL (ref 3.5–5.2)
Chloride: 100 mEq/L (ref 96–112)
Creatinine, Ser: 4.68 mg/dL — ABNORMAL HIGH (ref 0.50–1.35)
GFR calc non Af Amer: 13 mL/min — ABNORMAL LOW (ref >60)

## 2011-09-20 LAB — IRON AND TIBC: TIBC: 235 ug/dL (ref 215–435)

## 2011-09-21 LAB — PTH, INTACT AND CALCIUM: PTH: 467.8 pg/mL — ABNORMAL HIGH (ref 14.0–72.0)

## 2011-09-21 LAB — POCT HEMOGLOBIN-HEMACUE: Hemoglobin: 9.9 g/dL — ABNORMAL LOW (ref 13.0–17.0)

## 2011-09-26 ENCOUNTER — Other Ambulatory Visit: Payer: Self-pay

## 2011-09-26 DIAGNOSIS — Z01810 Encounter for preprocedural cardiovascular examination: Secondary | ICD-10-CM

## 2011-09-26 DIAGNOSIS — N189 Chronic kidney disease, unspecified: Secondary | ICD-10-CM

## 2011-09-28 ENCOUNTER — Other Ambulatory Visit: Payer: Self-pay | Admitting: Nephrology

## 2011-09-28 ENCOUNTER — Emergency Department (HOSPITAL_COMMUNITY)
Admission: EM | Admit: 2011-09-28 | Discharge: 2011-09-28 | Disposition: A | Discharge status: Home / Self Care | Payer: Medicare Other | Attending: Emergency Medicine | Admitting: Emergency Medicine

## 2011-09-28 ENCOUNTER — Encounter (HOSPITAL_COMMUNITY)
Admission: RE | Admit: 2011-09-28 | Discharge: 2011-09-28 | Disposition: A | Discharge status: Home / Self Care | Payer: Medicare Other | Source: Ambulatory Visit | Attending: Nephrology | Admitting: Nephrology

## 2011-09-28 DIAGNOSIS — Z794 Long term (current) use of insulin: Secondary | ICD-10-CM | POA: Insufficient documentation

## 2011-09-28 DIAGNOSIS — E119 Type 2 diabetes mellitus without complications: Secondary | ICD-10-CM | POA: Insufficient documentation

## 2011-09-28 DIAGNOSIS — L0390 Cellulitis, unspecified: Secondary | ICD-10-CM | POA: Insufficient documentation

## 2011-09-28 DIAGNOSIS — D638 Anemia in other chronic diseases classified elsewhere: Secondary | ICD-10-CM | POA: Insufficient documentation

## 2011-09-28 DIAGNOSIS — I10 Essential (primary) hypertension: Secondary | ICD-10-CM | POA: Insufficient documentation

## 2011-09-28 DIAGNOSIS — L0291 Cutaneous abscess, unspecified: Secondary | ICD-10-CM | POA: Insufficient documentation

## 2011-09-28 DIAGNOSIS — N183 Chronic kidney disease, stage 3 unspecified: Secondary | ICD-10-CM | POA: Insufficient documentation

## 2011-10-01 LAB — WOUND CULTURE

## 2011-10-03 ENCOUNTER — Encounter: Payer: Self-pay | Admitting: Vascular Surgery

## 2011-10-10 ENCOUNTER — Encounter (HOSPITAL_COMMUNITY): Payer: Medicare Other

## 2011-10-11 ENCOUNTER — Other Ambulatory Visit: Payer: Self-pay | Admitting: Nephrology

## 2011-10-11 ENCOUNTER — Encounter (HOSPITAL_COMMUNITY): Payer: Medicare Other

## 2011-10-11 ENCOUNTER — Ambulatory Visit: Payer: Medicare Other | Admitting: Vascular Surgery

## 2011-10-11 ENCOUNTER — Other Ambulatory Visit: Payer: Medicare Other

## 2011-10-12 LAB — POCT HEMOGLOBIN-HEMACUE: Hemoglobin: 11.3 g/dL — ABNORMAL LOW (ref 13.0–17.0)

## 2011-10-18 ENCOUNTER — Other Ambulatory Visit: Payer: Self-pay | Admitting: Nephrology

## 2011-10-18 ENCOUNTER — Encounter (HOSPITAL_COMMUNITY): Payer: Medicare Other

## 2011-10-18 LAB — POCT HEMOGLOBIN-HEMACUE: Hemoglobin: 10.9 g/dL — ABNORMAL LOW (ref 13.0–17.0)

## 2011-10-25 ENCOUNTER — Encounter (HOSPITAL_COMMUNITY): Payer: Medicare Other

## 2011-10-31 IMAGING — CR DG CHEST 2V
2 series · 2 of 2 positions shown · non-contrast
Comparison: 10/26/2010

CLINICAL DATA: Chest pain.  Shortness of breath.

CHEST - 2 VIEW

[w chest lat]
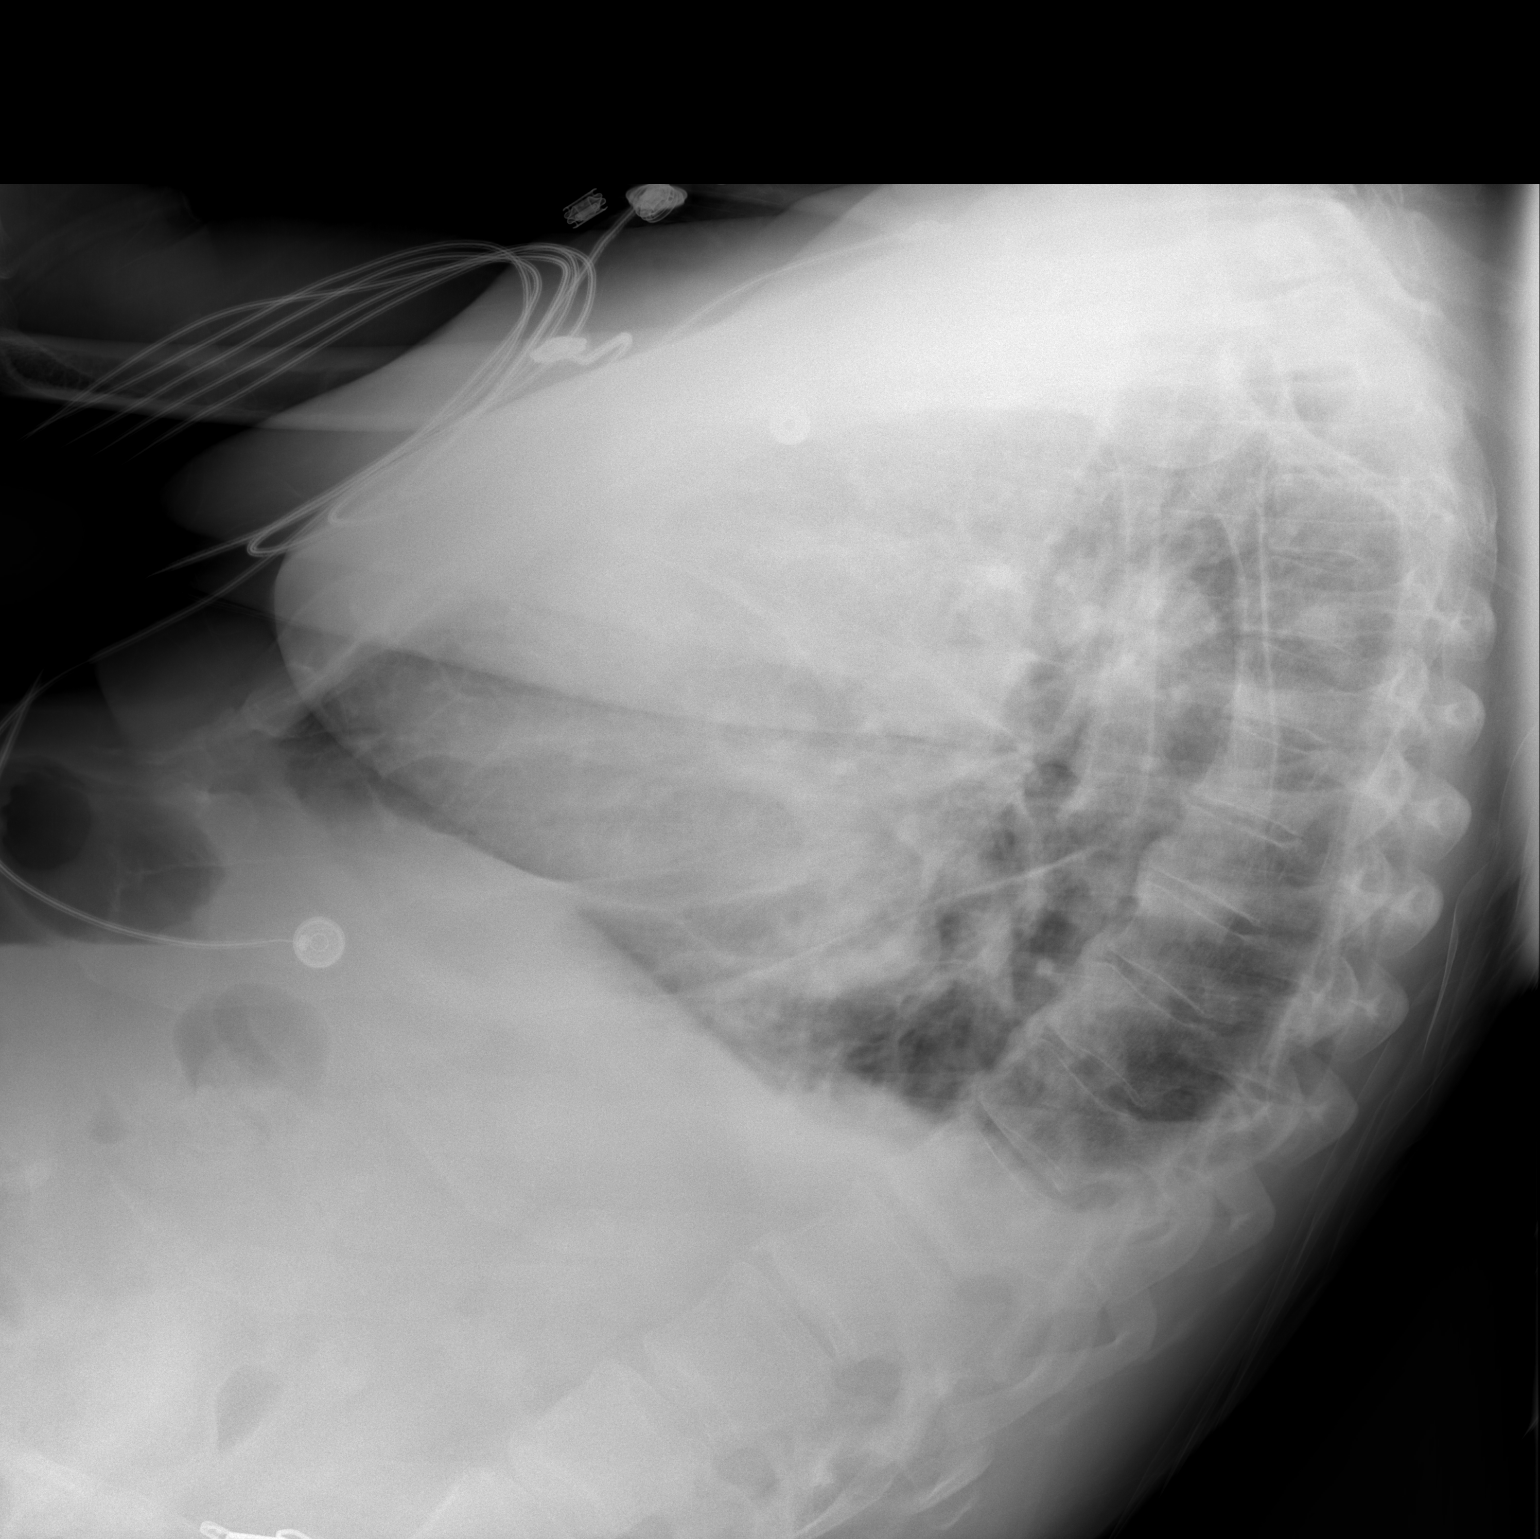

[view not recorded]
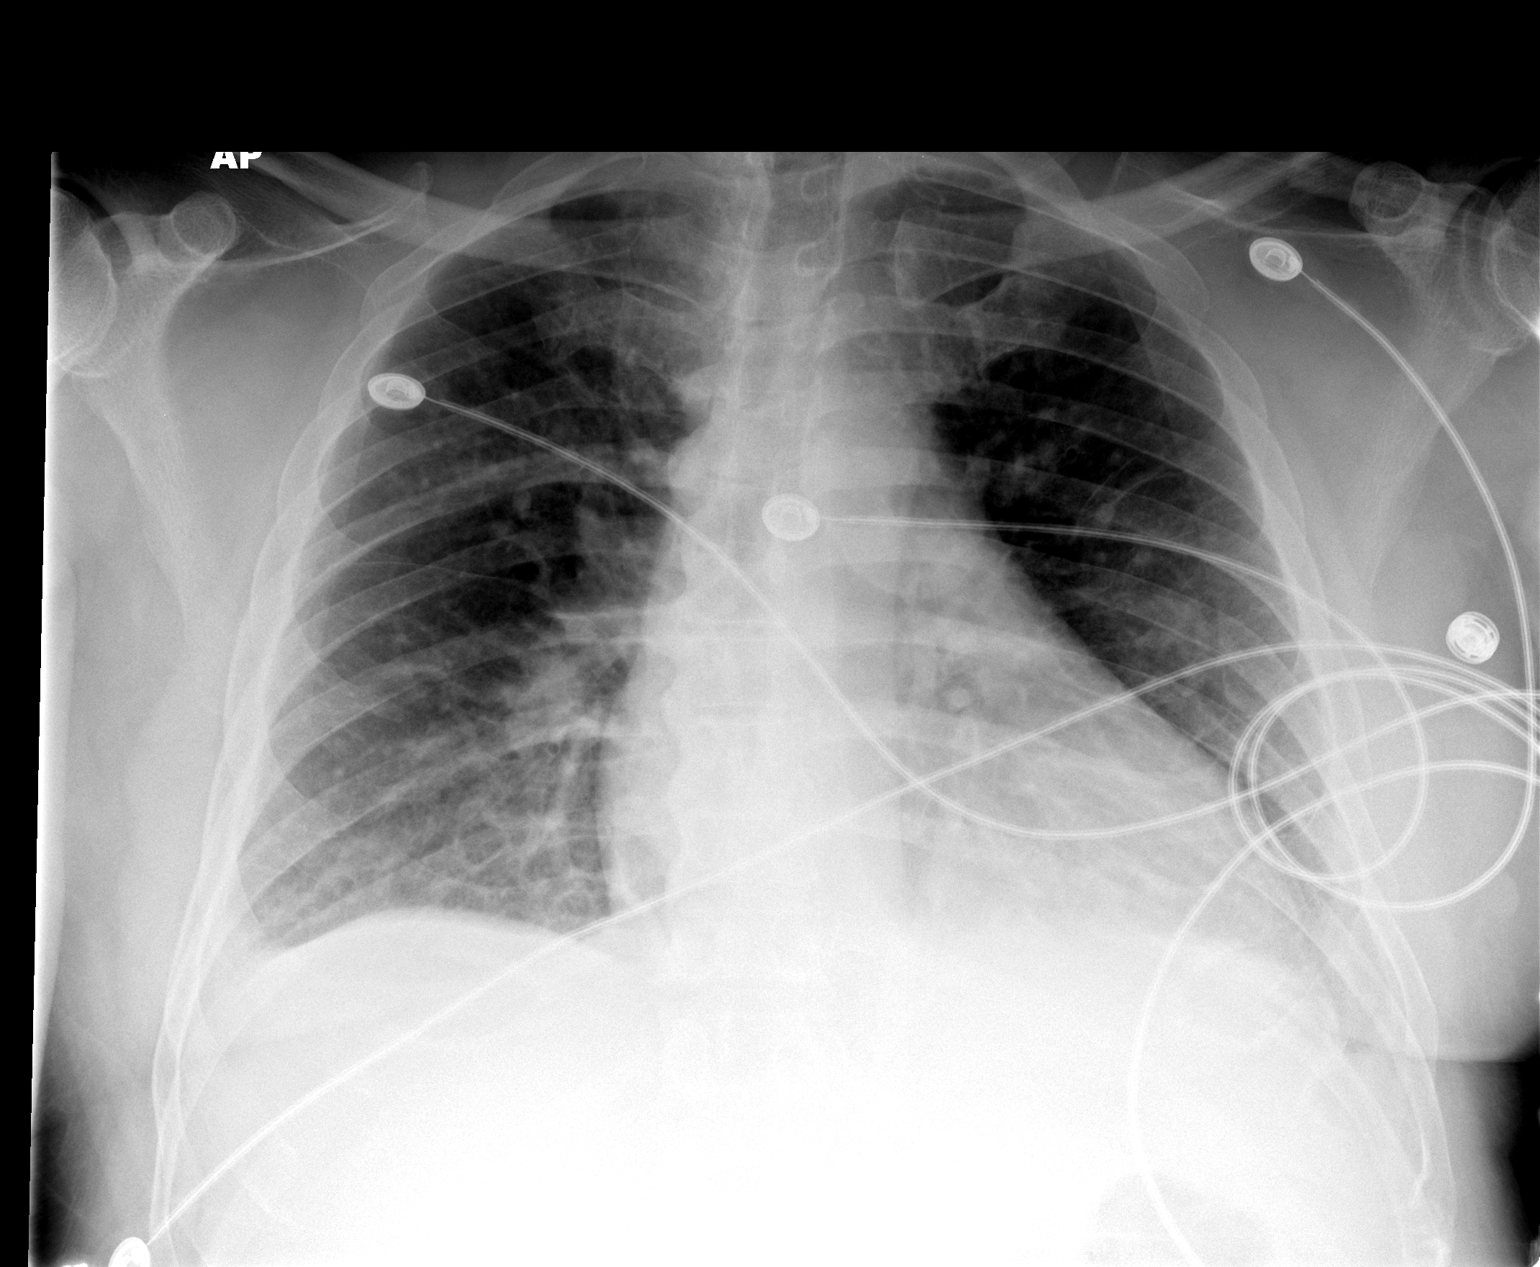

[2 of 2 positions shown; findings below may reference images not displayed]

FINDINGS: Cardiomegaly stable.  Increased bibasilar interstitial
prominence is seen suspicious for mild interstitial edema.  New
small bilateral pleural effusions are also noted.  No evidence of
pulmonary consolidation or mass.
IMPRESSION: Mild congestive heart failure with small bilateral pleural
effusions.

## 2011-11-21 ENCOUNTER — Other Ambulatory Visit (HOSPITAL_COMMUNITY): Payer: Self-pay | Admitting: *Deleted

## 2011-11-24 ENCOUNTER — Encounter (HOSPITAL_COMMUNITY)
Admission: RE | Admit: 2011-11-24 | Discharge: 2011-11-24 | Disposition: A | Discharge status: Home / Self Care | Payer: Medicare Other | Source: Ambulatory Visit | Attending: Nephrology | Admitting: Nephrology

## 2011-11-24 DIAGNOSIS — D638 Anemia in other chronic diseases classified elsewhere: Secondary | ICD-10-CM | POA: Insufficient documentation

## 2011-11-24 DIAGNOSIS — N183 Chronic kidney disease, stage 3 unspecified: Secondary | ICD-10-CM | POA: Insufficient documentation

## 2011-11-24 MED ORDER — EPOETIN ALFA 10000 UNIT/ML IJ SOLN
INTRAMUSCULAR | Status: AC
  Filled 2011-11-24: qty 1

## 2011-11-24 MED ORDER — EPOETIN ALFA 10000 UNIT/ML IJ SOLN
30000.0000 [IU] | INTRAMUSCULAR | Status: DC
  Administered 2011-11-24: 30000 [IU] via SUBCUTANEOUS

## 2011-11-24 MED ORDER — EPOETIN ALFA 20000 UNIT/ML IJ SOLN
INTRAMUSCULAR | Status: AC
  Filled 2011-11-24: qty 1

## 2011-11-28 LAB — POCT HEMOGLOBIN-HEMACUE: Hemoglobin: 10.8 g/dL — ABNORMAL LOW (ref 13.0–17.0)

## 2011-12-07 ENCOUNTER — Encounter (HOSPITAL_COMMUNITY): Payer: Medicare Other

## 2011-12-07 ENCOUNTER — Encounter (HOSPITAL_COMMUNITY)
Admission: RE | Admit: 2011-12-07 | Discharge: 2011-12-07 | Disposition: A | Discharge status: Home / Self Care | Payer: Medicare Other | Source: Ambulatory Visit | Attending: Nephrology | Admitting: Nephrology

## 2011-12-07 DIAGNOSIS — D638 Anemia in other chronic diseases classified elsewhere: Secondary | ICD-10-CM | POA: Insufficient documentation

## 2011-12-07 DIAGNOSIS — N183 Chronic kidney disease, stage 3 unspecified: Secondary | ICD-10-CM | POA: Insufficient documentation

## 2011-12-07 MED ORDER — EPOETIN ALFA 10000 UNIT/ML IJ SOLN
INTRAMUSCULAR | Status: AC
  Administered 2011-12-07: 14:00:00 via SUBCUTANEOUS
  Filled 2011-12-07: qty 1

## 2011-12-07 MED ORDER — EPOETIN ALFA 20000 UNIT/ML IJ SOLN
INTRAMUSCULAR | Status: AC
  Administered 2011-12-07: 14:00:00 via SUBCUTANEOUS
  Filled 2011-12-07: qty 1

## 2011-12-08 ENCOUNTER — Encounter (HOSPITAL_COMMUNITY): Payer: Medicare Other

## 2011-12-13 ENCOUNTER — Other Ambulatory Visit: Payer: Self-pay | Admitting: *Deleted

## 2011-12-13 ENCOUNTER — Other Ambulatory Visit: Payer: Medicare Other

## 2011-12-13 DIAGNOSIS — M7989 Other specified soft tissue disorders: Secondary | ICD-10-CM

## 2011-12-14 ENCOUNTER — Inpatient Hospital Stay: Admission: RE | Admit: 2011-12-14 | Payer: Medicare Other | Source: Ambulatory Visit

## 2011-12-15 ENCOUNTER — Other Ambulatory Visit: Payer: Self-pay | Admitting: Cardiology

## 2011-12-21 ENCOUNTER — Encounter (HOSPITAL_COMMUNITY)
Admission: RE | Admit: 2011-12-21 | Discharge: 2011-12-21 | Disposition: A | Discharge status: Home / Self Care | Payer: Medicare Other | Source: Ambulatory Visit | Attending: Nephrology | Admitting: Nephrology

## 2011-12-21 MED ORDER — EPOETIN ALFA 20000 UNIT/ML IJ SOLN
INTRAMUSCULAR | Status: AC
  Filled 2011-12-21: qty 1

## 2011-12-21 MED ORDER — EPOETIN ALFA 10000 UNIT/ML IJ SOLN
30000.0000 [IU] | INTRAMUSCULAR | Status: DC
  Administered 2011-12-21: 30000 [IU] via SUBCUTANEOUS

## 2011-12-21 MED ORDER — EPOETIN ALFA 10000 UNIT/ML IJ SOLN
INTRAMUSCULAR | Status: AC
  Administered 2011-12-21: 30000 [IU] via SUBCUTANEOUS
  Filled 2011-12-21: qty 1

## 2012-01-03 ENCOUNTER — Other Ambulatory Visit (HOSPITAL_COMMUNITY): Payer: Self-pay | Admitting: *Deleted

## 2012-01-04 ENCOUNTER — Encounter (HOSPITAL_COMMUNITY): Payer: Medicare Other

## 2012-01-06 ENCOUNTER — Other Ambulatory Visit: Payer: Self-pay | Admitting: Cardiology

## 2012-01-07 ENCOUNTER — Emergency Department (HOSPITAL_COMMUNITY): Payer: Medicare Other

## 2012-01-07 ENCOUNTER — Emergency Department (HOSPITAL_COMMUNITY)
Admission: EM | Admit: 2012-01-07 | Discharge: 2012-01-07 | Disposition: A | Discharge status: Home / Self Care | Payer: Medicare Other | Attending: Emergency Medicine | Admitting: Emergency Medicine

## 2012-01-07 ENCOUNTER — Other Ambulatory Visit: Payer: Self-pay

## 2012-01-07 ENCOUNTER — Encounter (HOSPITAL_COMMUNITY): Payer: Self-pay | Admitting: Emergency Medicine

## 2012-01-07 DIAGNOSIS — D649 Anemia, unspecified: Secondary | ICD-10-CM | POA: Insufficient documentation

## 2012-01-07 DIAGNOSIS — R5381 Other malaise: Secondary | ICD-10-CM | POA: Insufficient documentation

## 2012-01-07 DIAGNOSIS — E785 Hyperlipidemia, unspecified: Secondary | ICD-10-CM | POA: Insufficient documentation

## 2012-01-07 DIAGNOSIS — E119 Type 2 diabetes mellitus without complications: Secondary | ICD-10-CM | POA: Insufficient documentation

## 2012-01-07 DIAGNOSIS — E079 Disorder of thyroid, unspecified: Secondary | ICD-10-CM | POA: Insufficient documentation

## 2012-01-07 DIAGNOSIS — R059 Cough, unspecified: Secondary | ICD-10-CM | POA: Insufficient documentation

## 2012-01-07 DIAGNOSIS — N189 Chronic kidney disease, unspecified: Secondary | ICD-10-CM | POA: Insufficient documentation

## 2012-01-07 DIAGNOSIS — Z79899 Other long term (current) drug therapy: Secondary | ICD-10-CM | POA: Insufficient documentation

## 2012-01-07 DIAGNOSIS — I129 Hypertensive chronic kidney disease with stage 1 through stage 4 chronic kidney disease, or unspecified chronic kidney disease: Secondary | ICD-10-CM | POA: Insufficient documentation

## 2012-01-07 DIAGNOSIS — I10 Essential (primary) hypertension: Secondary | ICD-10-CM

## 2012-01-07 DIAGNOSIS — I2510 Atherosclerotic heart disease of native coronary artery without angina pectoris: Secondary | ICD-10-CM | POA: Insufficient documentation

## 2012-01-07 DIAGNOSIS — K5900 Constipation, unspecified: Secondary | ICD-10-CM | POA: Insufficient documentation

## 2012-01-07 DIAGNOSIS — D72819 Decreased white blood cell count, unspecified: Secondary | ICD-10-CM | POA: Insufficient documentation

## 2012-01-07 DIAGNOSIS — R05 Cough: Secondary | ICD-10-CM

## 2012-01-07 DIAGNOSIS — Z7982 Long term (current) use of aspirin: Secondary | ICD-10-CM | POA: Insufficient documentation

## 2012-01-07 DIAGNOSIS — I509 Heart failure, unspecified: Secondary | ICD-10-CM

## 2012-01-07 LAB — BASIC METABOLIC PANEL
CO2: 29 mEq/L (ref 19–32)
Calcium: 8.4 mg/dL (ref 8.4–10.5)
Creatinine, Ser: 4.79 mg/dL — ABNORMAL HIGH (ref 0.50–1.35)
GFR calc Af Amer: 13 mL/min — ABNORMAL LOW (ref >90)
GFR calc non Af Amer: 11 mL/min — ABNORMAL LOW (ref >90)

## 2012-01-07 LAB — DIFFERENTIAL
Basophils Absolute: 0 10*3/uL (ref 0.0–0.1)
Eosinophils Absolute: 0 10*3/uL (ref 0.0–0.7)
Lymphs Abs: 0.8 10*3/uL (ref 0.7–4.0)
Monocytes Absolute: 0.5 10*3/uL (ref 0.1–1.0)
Neutrophils Relative %: 39 % — ABNORMAL LOW (ref 43–77)

## 2012-01-07 LAB — CBC
MCH: 24.9 pg — ABNORMAL LOW (ref 26.0–34.0)
MCHC: 33.1 g/dL (ref 30.0–36.0)
MCV: 75.1 fL — ABNORMAL LOW (ref 78.0–100.0)
Platelets: 112 10*3/uL — ABNORMAL LOW (ref 150–400)
RDW: 17.3 % — ABNORMAL HIGH (ref 11.5–15.5)

## 2012-01-07 LAB — PRO B NATRIURETIC PEPTIDE: Pro B Natriuretic peptide (BNP): 4394 pg/mL — ABNORMAL HIGH (ref 0–125)

## 2012-01-07 LAB — POCT I-STAT TROPONIN I: Troponin i, poc: 0.02 ng/mL (ref 0.00–0.08)

## 2012-01-07 MED ORDER — ALBUTEROL SULFATE (5 MG/ML) 0.5% IN NEBU
5.0000 mg | INHALATION_SOLUTION | Freq: Once | RESPIRATORY_TRACT | Status: AC
  Administered 2012-01-07: 5 mg via RESPIRATORY_TRACT
  Filled 2012-01-07: qty 1

## 2012-01-07 MED ORDER — POTASSIUM CHLORIDE CRYS ER 20 MEQ PO TBCR
40.0000 meq | EXTENDED_RELEASE_TABLET | Freq: Once | ORAL | Status: AC
  Administered 2012-01-07: 40 meq via ORAL
  Filled 2012-01-07: qty 2

## 2012-01-07 NOTE — ED Notes (Signed)
Pt ambulated in hall with no issues. Pt states " I feel better".

## 2012-01-07 NOTE — ED Notes (Signed)
Pt c/o of cough, generalized weakness, and unable to have a bowel movement since Monday.
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
## 2012-01-09 ENCOUNTER — Encounter (HOSPITAL_COMMUNITY)
Admission: RE | Admit: 2012-01-09 | Discharge: 2012-01-09 | Disposition: A | Discharge status: Home / Self Care | Payer: Medicare Other | Source: Ambulatory Visit | Attending: Nephrology | Admitting: Nephrology

## 2012-01-09 DIAGNOSIS — D638 Anemia in other chronic diseases classified elsewhere: Secondary | ICD-10-CM | POA: Insufficient documentation

## 2012-01-09 DIAGNOSIS — N183 Chronic kidney disease, stage 3 unspecified: Secondary | ICD-10-CM | POA: Insufficient documentation

## 2012-01-09 LAB — RENAL FUNCTION PANEL
Albumin: 3.3 g/dL — ABNORMAL LOW (ref 3.5–5.2)
BUN: 37 mg/dL — ABNORMAL HIGH (ref 6–23)
CO2: 30 mEq/L (ref 19–32)
Chloride: 99 mEq/L (ref 96–112)
Potassium: 3 mEq/L — ABNORMAL LOW (ref 3.5–5.1)

## 2012-01-09 LAB — IRON AND TIBC
Iron: 69 ug/dL (ref 42–135)
Saturation Ratios: 30 % (ref 20–55)
UIBC: 160 ug/dL (ref 125–400)

## 2012-01-09 LAB — COMPREHENSIVE METABOLIC PANEL
ALT: 21 U/L (ref 0–53)
AST: 21 U/L (ref 0–37)
CO2: 32 mEq/L (ref 19–32)
Calcium: 7.9 mg/dL — ABNORMAL LOW (ref 8.4–10.5)
GFR calc non Af Amer: 12 mL/min — ABNORMAL LOW (ref >90)
Sodium: 143 mEq/L (ref 135–145)
Total Protein: 6.7 g/dL (ref 6.0–8.3)

## 2012-01-09 LAB — FERRITIN: Ferritin: 520 ng/mL — ABNORMAL HIGH (ref 22–322)

## 2012-01-09 MED ORDER — EPOETIN ALFA 10000 UNIT/ML IJ SOLN
INTRAMUSCULAR | Status: AC
  Filled 2012-01-09: qty 1

## 2012-01-09 MED ORDER — EPOETIN ALFA 10000 UNIT/ML IJ SOLN
30000.0000 [IU] | INTRAMUSCULAR | Status: DC
  Administered 2012-01-09: 30000 [IU] via SUBCUTANEOUS

## 2012-01-09 MED ORDER — EPOETIN ALFA 20000 UNIT/ML IJ SOLN
INTRAMUSCULAR | Status: AC
  Filled 2012-01-09: qty 1

## 2012-01-10 LAB — PTH, INTACT AND CALCIUM: Calcium, Total (PTH): 7.4 mg/dL — ABNORMAL LOW (ref 8.4–10.5)

## 2012-01-23 ENCOUNTER — Other Ambulatory Visit (HOSPITAL_COMMUNITY): Payer: Self-pay | Admitting: *Deleted

## 2012-01-23 DIAGNOSIS — E785 Hyperlipidemia, unspecified: Secondary | ICD-10-CM | POA: Insufficient documentation

## 2012-01-23 DIAGNOSIS — I10 Essential (primary) hypertension: Secondary | ICD-10-CM | POA: Insufficient documentation

## 2012-01-24 ENCOUNTER — Encounter (HOSPITAL_COMMUNITY)
Admission: RE | Admit: 2012-01-24 | Discharge: 2012-01-24 | Disposition: A | Discharge status: Home / Self Care | Payer: Medicare Other | Source: Ambulatory Visit | Attending: Nephrology | Admitting: Nephrology

## 2012-01-24 MED ORDER — EPOETIN ALFA 20000 UNIT/ML IJ SOLN
INTRAMUSCULAR | Status: AC
  Filled 2012-01-24: qty 1

## 2012-01-24 MED ORDER — EPOETIN ALFA 10000 UNIT/ML IJ SOLN
30000.0000 [IU] | INTRAMUSCULAR | Status: DC
  Administered 2012-01-24: 30000 [IU] via SUBCUTANEOUS

## 2012-01-24 MED ORDER — EPOETIN ALFA 10000 UNIT/ML IJ SOLN
INTRAMUSCULAR | Status: AC
  Administered 2012-01-24: 30000 [IU] via SUBCUTANEOUS
  Filled 2012-01-24: qty 1

## 2012-02-02 ENCOUNTER — Other Ambulatory Visit: Payer: Self-pay | Admitting: Cardiology

## 2012-02-07 ENCOUNTER — Other Ambulatory Visit: Payer: Self-pay | Admitting: Cardiology

## 2012-02-07 ENCOUNTER — Encounter (HOSPITAL_COMMUNITY): Payer: Medicare Other

## 2012-02-09 ENCOUNTER — Encounter (HOSPITAL_COMMUNITY)
Admission: RE | Admit: 2012-02-09 | Discharge: 2012-02-09 | Disposition: A | Discharge status: Home / Self Care | Payer: Medicare Other | Source: Ambulatory Visit | Attending: Nephrology | Admitting: Nephrology

## 2012-02-09 DIAGNOSIS — N183 Chronic kidney disease, stage 3 unspecified: Secondary | ICD-10-CM | POA: Insufficient documentation

## 2012-02-09 DIAGNOSIS — D638 Anemia in other chronic diseases classified elsewhere: Secondary | ICD-10-CM | POA: Insufficient documentation

## 2012-02-09 MED ORDER — EPOETIN ALFA 20000 UNIT/ML IJ SOLN
INTRAMUSCULAR | Status: AC
  Administered 2012-02-09: 30000 [IU]
  Filled 2012-02-09: qty 1

## 2012-02-09 MED ORDER — EPOETIN ALFA 10000 UNIT/ML IJ SOLN: 30000.0000 [IU] | INTRAMUSCULAR | Status: DC

## 2012-02-09 MED ORDER — EPOETIN ALFA 10000 UNIT/ML IJ SOLN
INTRAMUSCULAR | Status: AC
  Filled 2012-02-09: qty 1

## 2012-02-10 MED FILL — Epoetin Alfa Inj 10000 Unit/ML: INTRAMUSCULAR | Qty: 1 | Status: AC

## 2012-02-13 ENCOUNTER — Other Ambulatory Visit: Payer: Self-pay | Admitting: Cardiology

## 2012-02-23 ENCOUNTER — Encounter (HOSPITAL_COMMUNITY): Payer: Medicare Other

## 2012-02-26 ENCOUNTER — Other Ambulatory Visit: Payer: Self-pay | Admitting: Cardiology

## 2012-03-16 ENCOUNTER — Other Ambulatory Visit: Payer: Self-pay

## 2012-03-16 DIAGNOSIS — N185 Chronic kidney disease, stage 5: Secondary | ICD-10-CM

## 2012-03-16 DIAGNOSIS — Z01810 Encounter for preprocedural cardiovascular examination: Secondary | ICD-10-CM

## 2012-03-28 ENCOUNTER — Encounter: Payer: Self-pay | Admitting: Vascular Surgery

## 2012-03-29 ENCOUNTER — Ambulatory Visit (INDEPENDENT_AMBULATORY_CARE_PROVIDER_SITE_OTHER): Payer: Medicare Other | Admitting: Thoracic Diseases

## 2012-03-29 ENCOUNTER — Ambulatory Visit (INDEPENDENT_AMBULATORY_CARE_PROVIDER_SITE_OTHER): Payer: Medicare Other | Admitting: Vascular Surgery

## 2012-03-29 ENCOUNTER — Encounter: Payer: Self-pay | Admitting: Vascular Surgery

## 2012-03-29 VITALS — BP 184/77 | HR 63 | Temp 97.7°F | Resp 16 | Ht 71.0 in | Wt 215.0 lb

## 2012-03-29 DIAGNOSIS — N185 Chronic kidney disease, stage 5: Secondary | ICD-10-CM

## 2012-03-29 DIAGNOSIS — Z01810 Encounter for preprocedural cardiovascular examination: Secondary | ICD-10-CM

## 2012-03-29 NOTE — Progress Notes (Signed)
Bilateral UE Vein mapping performed @ [facility] 03/29/2012
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
## 2012-04-06 NOTE — Procedures (Unsigned)
CEPHALIC VEIN MAPPING  INDICATION:  Chronic kidney disease stage 5.  HISTORY: Atrial fibrillation, CHF, diabetes, chronic kidney disease, and previously a smoker.  EXAM:  The right cephalic vein is compressible.  Diameter measurements range from 0.30 to 0.68 cm.  The right basilic vein is compressible.  Diameter measurements range from 0.20 to 0.38 cm.  The left cephalic vein is compressible.  Diameter measurements range from 0.29 to 0.61 cm.  The left basilic vein is compressible.  Diameter measurements range from 0.35 to 0.56 cm.  See attached worksheet for all measurements.  IMPRESSION:  Patent bilateral cephalic and basilic veins with diameter measurements as described above and on worksheet.  ___________________________________________ Janetta Hora. Fields, MD  SH/MEDQ  D:  03/29/2012  T:  03/29/2012  Job:  [id number]

## 2012-04-09 ENCOUNTER — Encounter (HOSPITAL_COMMUNITY)
Admission: RE | Admit: 2012-04-09 | Discharge: 2012-04-09 | Disposition: A | Discharge status: Home / Self Care | Payer: Medicare Other | Source: Ambulatory Visit | Attending: Nephrology | Admitting: Nephrology

## 2012-04-09 DIAGNOSIS — N183 Chronic kidney disease, stage 3 unspecified: Secondary | ICD-10-CM | POA: Insufficient documentation

## 2012-04-09 DIAGNOSIS — D638 Anemia in other chronic diseases classified elsewhere: Secondary | ICD-10-CM | POA: Insufficient documentation

## 2012-04-09 LAB — RENAL FUNCTION PANEL
Albumin: 3.5 g/dL (ref 3.5–5.2)
Chloride: 98 mEq/L (ref 96–112)
Creatinine, Ser: 5.7 mg/dL — ABNORMAL HIGH (ref 0.50–1.35)
GFR calc non Af Amer: 9 mL/min — ABNORMAL LOW (ref >90)

## 2012-04-09 LAB — POCT HEMOGLOBIN-HEMACUE: Hemoglobin: 7.5 g/dL — ABNORMAL LOW (ref 13.0–17.0)

## 2012-04-09 MED ORDER — EPOETIN ALFA 10000 UNIT/ML IJ SOLN
30000.0000 [IU] | INTRAMUSCULAR | Status: DC
  Administered 2012-04-09: 30000 [IU] via SUBCUTANEOUS

## 2012-04-09 MED ORDER — EPOETIN ALFA 20000 UNIT/ML IJ SOLN
INTRAMUSCULAR | Status: AC
  Filled 2012-04-09: qty 1

## 2012-04-09 MED ORDER — EPOETIN ALFA 10000 UNIT/ML IJ SOLN
INTRAMUSCULAR | Status: AC
  Administered 2012-04-09: 30000 [IU] via SUBCUTANEOUS
  Filled 2012-04-09: qty 1

## 2012-04-09 NOTE — Progress Notes (Signed)
Hemoglobin 7.5 reported to [employer], CMA.

## 2012-04-11 ENCOUNTER — Other Ambulatory Visit: Payer: Self-pay | Admitting: *Deleted

## 2012-04-11 DIAGNOSIS — N400 Enlarged prostate without lower urinary tract symptoms: Secondary | ICD-10-CM | POA: Insufficient documentation

## 2012-04-23 ENCOUNTER — Encounter (HOSPITAL_COMMUNITY)
Admission: RE | Admit: 2012-04-23 | Discharge: 2012-04-23 | Disposition: A | Discharge status: Home / Self Care | Payer: Medicare Other | Source: Ambulatory Visit | Attending: Nephrology | Admitting: Nephrology

## 2012-04-23 ENCOUNTER — Other Ambulatory Visit: Payer: Self-pay | Admitting: Cardiology

## 2012-04-23 LAB — POCT HEMOGLOBIN-HEMACUE: Hemoglobin: 8 g/dL — ABNORMAL LOW (ref 13.0–17.0)

## 2012-04-23 LAB — IRON AND TIBC: UIBC: 213 ug/dL (ref 125–400)

## 2012-04-23 MED ORDER — EPOETIN ALFA 20000 UNIT/ML IJ SOLN
INTRAMUSCULAR | Status: AC
  Filled 2012-04-23: qty 1

## 2012-04-23 MED ORDER — EPOETIN ALFA 10000 UNIT/ML IJ SOLN
INTRAMUSCULAR | Status: AC
  Filled 2012-04-23: qty 1

## 2012-04-23 MED ORDER — EPOETIN ALFA 10000 UNIT/ML IJ SOLN
30000.0000 [IU] | INTRAMUSCULAR | Status: DC
  Administered 2012-04-23: 30000 [IU] via SUBCUTANEOUS

## 2012-04-24 LAB — PTH, INTACT AND CALCIUM: PTH: 487.7 pg/mL — ABNORMAL HIGH (ref 14.0–72.0)

## 2012-04-30 NOTE — Pre-Procedure Instructions (Signed)
20 [address] NASIF BOS  04/30/2012   Your procedure is scheduled on:  Mon, May 13 @ 12:25 PM  Report to [facility] at 8:30 AM.  Call this number if you have problems the morning of surgery: [phone]   Remember:   Do not eat food:After Midnight.  May have clear liquids: up to 4 Hours before arrival.(until 4:30 am)  Clear liquids include soda, tea, black coffee, apple or grape juice, broth,water  Take these medicines the morning of surgery with A SIP OF WATER: Amlodipine,Carvedilol,Clonidine,Isosorbide,and Metoprolol   Do not wear jewelry  Do not wear lotions, powders, or perfumes.   Do not bring valuables to the hospital.  Contacts, dentures or bridgework may not be worn into surgery.  Leave suitcase in the car. After surgery it may be brought to your room.  For patients admitted to the hospital, checkout time is 11:00 AM the day of discharge.   Patients discharged the day of surgery wilame and phone number of your driver:   Special Instructions: CHG Shower Use Special Wash: 1/2 bottle night before surgery and 1/2 bottle morning of surgery.   Please read over the following fact sheets that you were given: Pain Booklet, Coughing and Deep Breathing, MRSA Information and Surgical Site Infection Prevention

## 2012-05-01 ENCOUNTER — Other Ambulatory Visit: Payer: Self-pay | Admitting: Cardiology

## 2012-05-01 ENCOUNTER — Encounter (HOSPITAL_COMMUNITY)
Admission: RE | Admit: 2012-05-01 | Discharge: 2012-05-01 | Disposition: A | Discharge status: Home / Self Care | Payer: Medicare Other | Source: Ambulatory Visit | Attending: Anesthesiology | Admitting: Anesthesiology

## 2012-05-01 ENCOUNTER — Encounter (HOSPITAL_COMMUNITY): Payer: Self-pay | Admitting: Respiratory Therapy

## 2012-05-01 ENCOUNTER — Encounter (HOSPITAL_COMMUNITY)
Admission: RE | Admit: 2012-05-01 | Discharge: 2012-05-01 | Disposition: A | Discharge status: Home / Self Care | Payer: Medicare Other | Source: Ambulatory Visit | Attending: Vascular Surgery | Admitting: Vascular Surgery

## 2012-05-01 ENCOUNTER — Encounter (HOSPITAL_COMMUNITY): Payer: Self-pay

## 2012-05-01 HISTORY — DX: Edema, unspecified: R60.9

## 2012-05-01 HISTORY — DX: Localized edema: R60.0

## 2012-05-01 HISTORY — DX: Reserved for inherently not codable concepts without codable children: IMO0001

## 2012-05-01 HISTORY — DX: Encounter for other specified aftercare: Z51.89

## 2012-05-01 HISTORY — DX: Bronchitis, not specified as acute or chronic: J40

## 2012-05-01 HISTORY — DX: Personal history of colon polyps, unspecified: Z86.0100

## 2012-05-01 HISTORY — DX: Personal history of colonic polyps: Z86.010

## 2012-05-01 HISTORY — DX: Urgency of urination: R39.15

## 2012-05-01 HISTORY — DX: Unspecified staphylococcus as the cause of diseases classified elsewhere: B95.8

## 2012-05-01 HISTORY — DX: Frequency of micturition: R35.0

## 2012-05-01 HISTORY — DX: Xerosis cutis: L85.3

## 2012-05-01 HISTORY — DX: Nocturia: R35.1

## 2012-05-01 HISTORY — DX: Cardiac murmur, unspecified: R01.1

## 2012-05-01 HISTORY — DX: Gastro-esophageal reflux disease without esophagitis: K21.9

## 2012-05-01 LAB — SURGICAL PCR SCREEN: MRSA, PCR: POSITIVE — AB

## 2012-05-01 NOTE — Progress Notes (Signed)
Average fasting blood sugar 200 

## 2012-05-01 NOTE — Progress Notes (Signed)
Sleep study done >48yrs ago and pt does use a CPAP

## 2012-05-01 NOTE — Progress Notes (Signed)
Cardiologist is Dr.Harwani with last visit being a couple of weeks ago-to be requested   Echo and Stress test done at [facility] a couple of wks ago-to be requested  Heart cath done > 63yrs ago-to be requested  Medical MD is also Dr.Harwani

## 2012-05-02 ENCOUNTER — Other Ambulatory Visit (HOSPITAL_COMMUNITY): Payer: Self-pay | Admitting: *Deleted

## 2012-05-03 ENCOUNTER — Telehealth: Payer: Self-pay | Admitting: Pulmonary Disease

## 2012-05-03 NOTE — Telephone Encounter (Signed)
Pt has not bee seen in at least five years so I advised will need a new pt appt in order to get rx for machine. [facility] had opening tomorrow at 11:45 for consult. Appt set. Pt wife is aware. Carron Curie, CMA

## 2012-05-03 NOTE — Progress Notes (Signed)
Spoke with Cameron [name] in Dr. Annitta Jersey office. States pt was last seen there in Feb, 2013. OV note has not been "dictated" but she will fax what she has to [phone].

## 2012-05-04 ENCOUNTER — Institutional Professional Consult (permissible substitution): Payer: Medicare Other | Admitting: Pulmonary Disease

## 2012-05-04 ENCOUNTER — Ambulatory Visit (INDEPENDENT_AMBULATORY_CARE_PROVIDER_SITE_OTHER): Payer: Medicare Other | Admitting: Pulmonary Disease

## 2012-05-04 ENCOUNTER — Encounter: Payer: Self-pay | Admitting: Pulmonary Disease

## 2012-05-04 VITALS — BP 160/70 | HR 61 | Temp 97.9°F | Ht 71.0 in | Wt 212.8 lb

## 2012-05-04 DIAGNOSIS — G4733 Obstructive sleep apnea (adult) (pediatric): Secondary | ICD-10-CM | POA: Insufficient documentation

## 2012-05-04 NOTE — Patient Instructions (Signed)
Will get you a new cpap machine, and reoptimize pressure on the automatic mode for the first few weeks.  I will let you know your optimal pressure once I receive the download. Will get you a chin strap to use if you are having mouth opening. Work on weight loss If doing well, followup with me in one year.
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
## 2012-05-04 NOTE — Progress Notes (Signed)
The Cardiac work up was seen and is adequate for the planned surgery.

## 2012-05-04 NOTE — Assessment & Plan Note (Signed)
The patient has a history of severe obstructive sleep apnea, and is now having disrupted and nonrestorative sleep despite wearing CPAP compliantly.  He is also having significant daytime sleepiness.  I suspect his CPAP machine is not working properly because of its age, and he may be having oral venting as well.  His CPAP pressure also needs to be optimized again.  We'll get the patient a new CPAP machine and work on re-optimization.  Will also try chin strap.  I have encouraged him to work aggressively on weight loss.

## 2012-05-04 NOTE — Progress Notes (Signed)
Put in basket for Shonna Chock PA to review cardiac records and Dr. Sharyn Lull note.

## 2012-05-06 MED ORDER — DEXTROSE 5 % IV SOLN
1.5000 g | INTRAVENOUS | Status: AC
  Administered 2012-05-07: 1.5 g via INTRAVENOUS
  Filled 2012-05-06: qty 1.5

## 2012-05-07 ENCOUNTER — Encounter (HOSPITAL_COMMUNITY)
Admission: RE | Disposition: A | Discharge status: Home / Self Care | Payer: Self-pay | Source: Ambulatory Visit | Attending: Vascular Surgery

## 2012-05-07 ENCOUNTER — Encounter (HOSPITAL_COMMUNITY): Payer: Medicare Other

## 2012-05-07 ENCOUNTER — Encounter (HOSPITAL_COMMUNITY): Payer: Self-pay | Admitting: *Deleted

## 2012-05-07 ENCOUNTER — Encounter (HOSPITAL_COMMUNITY): Payer: Self-pay | Admitting: Anesthesiology

## 2012-05-07 ENCOUNTER — Ambulatory Visit (HOSPITAL_COMMUNITY)
Admission: RE | Admit: 2012-05-07 | Discharge: 2012-05-07 | Disposition: A | Discharge status: Home / Self Care | Payer: Medicare Other | Source: Ambulatory Visit | Attending: Vascular Surgery | Admitting: Vascular Surgery

## 2012-05-07 ENCOUNTER — Ambulatory Visit (HOSPITAL_COMMUNITY): Payer: Medicare Other | Admitting: Certified Registered Nurse Anesthetist

## 2012-05-07 ENCOUNTER — Telehealth: Payer: Self-pay | Admitting: Vascular Surgery

## 2012-05-07 ENCOUNTER — Encounter (HOSPITAL_COMMUNITY): Payer: Self-pay | Admitting: Certified Registered Nurse Anesthetist

## 2012-05-07 DIAGNOSIS — I120 Hypertensive chronic kidney disease with stage 5 chronic kidney disease or end stage renal disease: Secondary | ICD-10-CM | POA: Insufficient documentation

## 2012-05-07 DIAGNOSIS — E119 Type 2 diabetes mellitus without complications: Secondary | ICD-10-CM | POA: Insufficient documentation

## 2012-05-07 DIAGNOSIS — N186 End stage renal disease: Secondary | ICD-10-CM | POA: Insufficient documentation

## 2012-05-07 DIAGNOSIS — E669 Obesity, unspecified: Secondary | ICD-10-CM | POA: Insufficient documentation

## 2012-05-07 DIAGNOSIS — Z01812 Encounter for preprocedural laboratory examination: Secondary | ICD-10-CM | POA: Insufficient documentation

## 2012-05-07 DIAGNOSIS — G4733 Obstructive sleep apnea (adult) (pediatric): Secondary | ICD-10-CM | POA: Insufficient documentation

## 2012-05-07 DIAGNOSIS — N185 Chronic kidney disease, stage 5: Secondary | ICD-10-CM

## 2012-05-07 DIAGNOSIS — Z01818 Encounter for other preprocedural examination: Secondary | ICD-10-CM | POA: Insufficient documentation

## 2012-05-07 DIAGNOSIS — E785 Hyperlipidemia, unspecified: Secondary | ICD-10-CM | POA: Insufficient documentation

## 2012-05-07 HISTORY — PX: AV FISTULA PLACEMENT: SHX1204

## 2012-05-07 LAB — POCT I-STAT 4, (NA,K, GLUC, HGB,HCT)
HCT: 26 % — ABNORMAL LOW (ref 39.0–52.0)
Hemoglobin: 8.8 g/dL — ABNORMAL LOW (ref 13.0–17.0)
Potassium: 3 mEq/L — ABNORMAL LOW (ref 3.5–5.1)
Sodium: 142 mEq/L (ref 135–145)

## 2012-05-07 LAB — GLUCOSE, CAPILLARY: Glucose-Capillary: 92 mg/dL (ref 70–99)

## 2012-05-07 SURGERY — ARTERIOVENOUS (AV) FISTULA CREATION
Anesthesia: Monitor Anesthesia Care | Site: Arm Lower | Laterality: Left | Wound class: Clean

## 2012-05-07 MED ORDER — LIDOCAINE HCL (PF) 1 % IJ SOLN
INTRAMUSCULAR | Status: DC | PRN
  Administered 2012-05-07: 10 mL

## 2012-05-07 MED ORDER — OXYCODONE HCL 5 MG PO TABS: 5.0000 mg | ORAL_TABLET | Freq: Four times a day (QID) | ORAL | Status: AC | PRN

## 2012-05-07 MED ORDER — HEPARIN SODIUM (PORCINE) 1000 UNIT/ML IJ SOLN
INTRAMUSCULAR | Status: DC | PRN
  Administered 2012-05-07: 5 mL via INTRAVENOUS

## 2012-05-07 MED ORDER — SODIUM CHLORIDE 0.9 % IR SOLN
Status: DC | PRN
  Administered 2012-05-07: 15:00:00

## 2012-05-07 MED ORDER — FENTANYL CITRATE 0.05 MG/ML IJ SOLN
INTRAMUSCULAR | Status: DC | PRN
  Administered 2012-05-07: 50 ug via INTRAVENOUS

## 2012-05-07 MED ORDER — FERUMOXYTOL INJECTION 510 MG/17 ML
510.0000 mg | INTRAVENOUS | Status: DC
  Administered 2012-05-07: 510 mg via INTRAVENOUS
  Filled 2012-05-07: qty 17

## 2012-05-07 MED ORDER — SODIUM CHLORIDE 0.9 % IV SOLN: INTRAVENOUS | Status: DC

## 2012-05-07 MED ORDER — HYDROMORPHONE HCL PF 1 MG/ML IJ SOLN
0.2500 mg | INTRAMUSCULAR | Status: DC | PRN
Start: 2012-05-07 — End: 2012-05-07

## 2012-05-07 MED ORDER — MORPHINE SULFATE 4 MG/ML IJ SOLN: 0.0500 mg/kg | INTRAMUSCULAR | Status: DC | PRN

## 2012-05-07 MED ORDER — MIDAZOLAM HCL 5 MG/5ML IJ SOLN
INTRAMUSCULAR | Status: DC | PRN
  Administered 2012-05-07: 2 mg via INTRAVENOUS

## 2012-05-07 MED ORDER — SODIUM CHLORIDE 0.9 % IV SOLN
INTRAVENOUS | Status: DC | PRN
  Administered 2012-05-07: 14:00:00 via INTRAVENOUS

## 2012-05-07 MED ORDER — PROPOFOL 10 MG/ML IV EMUL
INTRAVENOUS | Status: DC | PRN
  Administered 2012-05-07: 100 ug/kg/min via INTRAVENOUS

## 2012-05-07 MED ORDER — 0.9 % SODIUM CHLORIDE (POUR BTL) OPTIME
TOPICAL | Status: DC | PRN
  Administered 2012-05-07: 1000 mL

## 2012-05-07 SURGICAL SUPPLY — 45 items
ADH SKN CLS APL DERMABOND .7 (GAUZE/BANDAGES/DRESSINGS) ×1
CANISTER SUCTION 2500CC (MISCELLANEOUS) ×2 IMPLANT
CLIP TI MEDIUM 6 (CLIP) ×2 IMPLANT
CLIP TI WIDE RED SMALL 6 (CLIP) ×2 IMPLANT
CLOTH BEACON ORANGE TIMEOUT ST (SAFETY) ×2 IMPLANT
COVER PROBE W GEL 5X96 (DRAPES) ×2 IMPLANT
COVER SURGICAL LIGHT HANDLE (MISCELLANEOUS) ×4 IMPLANT
DECANTER SPIKE VIAL GLASS SM (MISCELLANEOUS) ×2 IMPLANT
DERMABOND ADVANCED (GAUZE/BANDAGES/DRESSINGS) ×1
DERMABOND ADVANCED .7 DNX12 (GAUZE/BANDAGES/DRESSINGS) ×1 IMPLANT
DRAIN PENROSE 1/4X12 LTX STRL (WOUND CARE) ×2 IMPLANT
ELECT REM PT RETURN 9FT ADLT (ELECTROSURGICAL) ×2
ELECTRODE REM PT RTRN 9FT ADLT (ELECTROSURGICAL) ×1 IMPLANT
GAUZE SPONGE 2X2 8PLY STRL LF (GAUZE/BANDAGES/DRESSINGS) IMPLANT
GEL ULTRASOUND 20GR AQUASONIC (MISCELLANEOUS) ×2 IMPLANT
GLOVE BIO SURGEON STRL SZ7.5 (GLOVE) ×4 IMPLANT
GLOVE BIOGEL M 6.5 STRL (GLOVE) ×2 IMPLANT
GLOVE BIOGEL PI IND STRL 6.5 (GLOVE) ×1 IMPLANT
GLOVE BIOGEL PI IND STRL 7.0 (GLOVE) ×2 IMPLANT
GLOVE BIOGEL PI INDICATOR 6.5 (GLOVE) ×1
GLOVE BIOGEL PI INDICATOR 7.0 (GLOVE) ×2
GLOVE ECLIPSE 6.5 STRL STRAW (GLOVE) ×2 IMPLANT
GLOVE SS BIOGEL STRL SZ 7 (GLOVE) ×1 IMPLANT
GLOVE SS BIOGEL STRL SZ 7.5 (GLOVE) ×1 IMPLANT
GLOVE SUPERSENSE BIOGEL SZ 7 (GLOVE) ×1
GLOVE SUPERSENSE BIOGEL SZ 7.5 (GLOVE) ×1
GLOVE SURG SS PI 7.5 STRL IVOR (GLOVE) ×2 IMPLANT
GOWN PREVENTION PLUS XLARGE (GOWN DISPOSABLE) ×6 IMPLANT
GOWN STRL NON-REIN LRG LVL3 (GOWN DISPOSABLE) ×6 IMPLANT
KIT BASIN OR (CUSTOM PROCEDURE TRAY) ×2 IMPLANT
KIT ROOM TURNOVER OR (KITS) ×2 IMPLANT
LOOP VESSEL MINI RED (MISCELLANEOUS) IMPLANT
NS IRRIG 1000ML POUR BTL (IV SOLUTION) ×2 IMPLANT
PACK CV ACCESS (CUSTOM PROCEDURE TRAY) ×2 IMPLANT
PAD ARMBOARD 7.5X6 YLW CONV (MISCELLANEOUS) ×4 IMPLANT
SPONGE GAUZE 2X2 STER 10/PKG (GAUZE/BANDAGES/DRESSINGS)
SPONGE SURGIFOAM ABS GEL 100 (HEMOSTASIS) IMPLANT
SUT PROLENE 7 0 BV 1 (SUTURE) ×6 IMPLANT
SUT VIC AB 3-0 SH 27 (SUTURE) ×2
SUT VIC AB 3-0 SH 27X BRD (SUTURE) ×1 IMPLANT
SUT VICRYL 4-0 PS2 18IN ABS (SUTURE) ×2 IMPLANT
TOWEL OR 17X24 6PK STRL BLUE (TOWEL DISPOSABLE) ×2 IMPLANT
TOWEL OR 17X26 10 PK STRL BLUE (TOWEL DISPOSABLE) ×2 IMPLANT
UNDERPAD 30X30 INCONTINENT (UNDERPADS AND DIAPERS) ×2 IMPLANT
WATER STERILE IRR 1000ML POUR (IV SOLUTION) ×2 IMPLANT

## 2012-05-07 NOTE — Progress Notes (Signed)
Dr Michelle Piper aware pt Cameron [name] 172/65 states pt may cont to be discharged home at this time.

## 2012-05-07 NOTE — Anesthesia Postprocedure Evaluation (Signed)
  Anesthesia Post-op Note  Patient: Cameron [name]  Procedure(s) Performed: Procedure(s) (LRB): ARTERIOVENOUS (AV) FISTULA CREATION (Left)  Patient Location: PACU  Anesthesia Type: MAC  Level of Consciousness: awake  Airway and Oxygen Therapy: Patient Spontanous Breathing  Post-op Pain: mild  Post-op Assessment: Post-op Vital signs reviewed  Post-op Vital Signs: Reviewed  Complications: No apparent anesthesia complications

## 2012-05-07 NOTE — Transfer of Care (Signed)
Immediate Anesthesia Transfer of Care Note  Patient: Cameron [name]  Procedure(s) Performed: Procedure(s) (LRB): ARTERIOVENOUS (AV) FISTULA CREATION (Left)  Patient Location: PACU  Anesthesia Type: MAC  Level of Consciousness: awake, alert  and oriented  Airway & Oxygen Therapy: Patient Spontanous Breathing and Patient connected to face mask oxygen  Post-op Assessment: Report given to PACU RN, Post -op Vital signs reviewed and stable and Patient moving all extremities  Post vital signs: Reviewed and stable  Complications: No apparent anesthesia complications
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
## 2012-05-07 NOTE — Discharge Instructions (Signed)
AV Fistula Care After Refer to this sheet in the next few weeks. These instructions provide you with information on caring for yourself after your procedure. Your caregiver may also give you more specific instructions. Your treatment has been planned according to current medical practices, but problems sometimes occur. Call your caregiver if you have any problems or questions after your procedure. HOME CARE INSTRUCTIONS   Do not drive a car or take public transportation alone.   Do not drink alcohol.   Only take medicine that has been prescribed by your caregiver.   Do not sign important papers or make important decisions.   Have a responsible person with you.   Ask your caregiver to show you how to check your access at home for a vibration (called a "thrill") or for a sound (called a "bruit" pronounced brew-ee).   Your vein will need time to enlarge and mature so needles can be inserted for dialysis. Follow your caregiver's instructions about what you need to do to make this happen.   Keep dressings clean and dry.   Keep the arm elevated above your heart. Use a pillow.   Rest.   Use the arm as usual for all activities.   Have the stitches or tape closures removed in 10 to 14 days, or as directed by your caregiver.   Do not sleep or lie on the area of the fistula or that arm. This may decrease or stop the blood flow through your fistula.   Do not allow blood pressures to be taken on this arm.   Do not allow blood drawing to be done from the graft.   Do not wear tight clothing around the access site or on the arm.   Avoid lifting heavy objects with the arm that has the fistula.   Do not use creams or lotions over the access site.  SEEK MEDICAL CARE IF:   You have a fever.   You have swelling around the fistula that gets worse, or you have new pain.   You have unusual bleeding at the fistula site or from any other area.   You have pus or other drainage at the fistula  site.   You have skin redness or red streaking on the skin around, above, or below the fistula site.   Your access site feels warm.   You have any flu-like symptoms.  SEEK IMMEDIATE MEDICAL CARE IF:   You have pain, numbness, or an unusual pale skin on the hand or on the side of your fistula.   You have dizziness or weakness that you have not had before.   You have shortness of breath.   You have chest pain.   Your fistula disconnects or breaks, and there is bleeding that cannot be easily controlled.  Call for local emergency medical help. Do not try to drive yourself to the hospital. MAKE SURE YOU  Understand these instructions.   Will watch your condition.   Will get help right away if you are not doing well or get worse.  Document Released: 12/12/2005 Document Revised: 12/01/2011 Document Reviewed: 06/01/2011 Exit[employer] Patient Information 2012 [employer]. 

## 2012-05-07 NOTE — Telephone Encounter (Addendum)
Message copied by Shari Prows on Mon May 07, 2012  4:13 PM ------      Message from: Fabienne Bruns E      Created: Mon May 07, 2012  3:55 PM       Left radial cephalic AVF      Collins asst      Follow up 1 month            Charles I scheduled an appt for pt on Thursday 06/07/12 at 1pm w/ CE[facility]. Spoke w/ pt's mother in law ZO:XWRU info and mailed letter to home also.Drinda Butts tobin

## 2012-05-07 NOTE — Preoperative (Signed)
Beta Blockers   Reason not to administer Beta Blockers:Not Applicable 

## 2012-05-07 NOTE — Anesthesia Preprocedure Evaluation (Addendum)
Anesthesia Evaluation  Patient identified by MRN, date of birth, ID band Patient awake    Reviewed: Allergy & Precautions, H&P , NPO status , Patient's Chart, lab work & pertinent test results, reviewed documented beta blocker date and time   Airway Mallampati: II TM Distance: >3 FB Neck ROM: Full    Dental  (+) Teeth Intact and Dental Advisory Given   Pulmonary shortness of breath and with exertion, sleep apnea ,  breath sounds clear to auscultation        Cardiovascular hypertension, Pt. on medications + CAD and +CHF + Valvular Problems/Murmurs Rhythm:Regular Rate:Normal     Neuro/Psych    GI/Hepatic Neg liver ROS, hiatal hernia, GERD-  ,  Endo/Other  Diabetes mellitus-, Type 2  Renal/GU CRFRenal disease     Musculoskeletal   Abdominal   Peds  Hematology negative hematology ROS (+)   Anesthesia Other Findings   Reproductive/Obstetrics                        Anesthesia Physical Anesthesia Plan  ASA: III  Anesthesia Plan: MAC   Post-op Pain Management:    Induction: Intravenous  Airway Management Planned: Simple Face Mask  Additional Equipment:   Intra-op Plan:   Post-operative Plan:   Informed Consent: I have reviewed the patients History and Physical, chart, labs and discussed the procedure including the risks, benefits and alternatives for the proposed anesthesia with the patient or authorized representative who has indicated his/her understanding and acceptance.     Plan Discussed with: CRNA  Anesthesia Plan Comments:         Anesthesia Quick Evaluation

## 2012-05-07 NOTE — Op Note (Signed)
Procedure: Left Radial Cephalic AV fistula   Preop: ESRD   Postop: ESRD   Anesthesia: MAC with local   Assistant:Maureen Collins, PA-c   Findings: 3 mm cephalic vein       3.5 mm radial artery   Procedure Details:  The left upper extremity was prepped and draped in usual sterile fashion. Local anesthesia was infiltrated midway between the cephalic and radial artery anatomically. The vein was not palpable due to the patients obesity. Ultrasound was used to identify the location of the vein. A longitudinal skin incision was then made in this location at the distal left forearm. The incision was carried into the subcutaneous tissues down to level cephalic vein. The vein had some spasm but was overall reasonable quality, 3 mm. The vein was dissected free circumferentially and small side branches ligated and divided between silk ties. The distal end was ligated with a 2 0 silk tie and the vein transected.  The ein was gently distended with heparinized saline and marked for orientation. Next the radial artery was dissected free in the medial portion incision. The artery was 3.5 mm in diameter. The vessel loops were placed proximal and distal to the planned site of arteriotomy. The patient was given 5000 units of intravenous heparin. After appropriate circulation time, the vessel loops were used to control the artery. A longitudinal opening was made in the left radial artery. The vein was controlled proximally with a fine bulldog clamp. The vein was then swung over to the artery and sewn end of vein to side of artery using a running 7-0 Prolene suture. Just prior to completion, the anastomosis was fore bled back bled and thoroughly flushed. The anastomosis was secured, vessel loops released, and there was a palpable thrill in the fistula immediately. After hemostasis was obtained, the subcutaneous tissues were reapproximated using a running 3-0 Vicryl suture. The skin was then closed with a 4 0 Vicryl  subcuticular stitch. Dermabond was applied to the skin incision. The patient tolerated the procedure well and there were no complications. Instrument sponge and needle count were correct at the end of the case. The patient was taken to PACU in stable condition.   Fabienne Bruns, MD  [facility] [place]  Office: [phone]  Pager: [phone]

## 2012-05-08 ENCOUNTER — Encounter (HOSPITAL_COMMUNITY): Payer: Self-pay | Admitting: Vascular Surgery

## 2012-05-09 NOTE — Addendum Note (Signed)
Addendum  created 05/09/12 1422 by Taeshaun Rames R Felecia Stanfill, CRNA   Modules edited:Anesthesia Medication Administration    

## 2012-05-09 NOTE — Addendum Note (Signed)
Addendum  created 05/09/12 1422 by Shireen Quan, CRNA   Modules edited:Anesthesia Medication Administration

## 2012-05-11 ENCOUNTER — Ambulatory Visit (INDEPENDENT_AMBULATORY_CARE_PROVIDER_SITE_OTHER): Payer: Medicare Other | Admitting: Vascular Surgery

## 2012-05-11 ENCOUNTER — Encounter: Payer: Self-pay | Admitting: Vascular Surgery

## 2012-05-11 VITALS — BP 177/69 | HR 65 | Temp 97.2°F | Ht 71.0 in | Wt 205.0 lb

## 2012-05-11 DIAGNOSIS — N186 End stage renal disease: Secondary | ICD-10-CM | POA: Insufficient documentation

## 2012-05-11 DIAGNOSIS — T82898A Other specified complication of vascular prosthetic devices, implants and grafts, initial encounter: Secondary | ICD-10-CM

## 2012-05-11 NOTE — Progress Notes (Signed)
[facility]   Postoperative Access Visit  History of Present Illness  Cameron [name] is a 66 y.o. year old male who presents for postoperative follow-up for: L RC AVF by Dr. Darrick Penna (Date: 05/07/12).  The patient's wounds are not healed: bleeding from incision.  The patient notes no steal symptoms.  The patient is  able to complete their activities of daily living.  The patient's current symptoms are: swelling in left hand and bleeding from incision.  Physical Examination  Filed Vitals:   05/11/12 1014  BP: 177/69  Pulse: 65  Temp: 97.2 F (36.2 C)   LUE: Incision is intact, venous bleeding from skin edge, skin feels warm, hand grip is 5/5, sensation in digits is intact, palpable thrill, bruit can be auscultated , L hand is swollen 1+ but the entire rest of the area is non edematous  Medical Decision Making  Cameron [name] is a 66 y.o. year old male who presents s/p L RC AVF.  I controlled the bleeding with pressure.  I removed the previous dermabond which had become somewhat bloody. I applying some skin cleaner and dressed the incision with Bacitracin and a sterile pressure dressing.    The patient will continue with the pattern of wound care.  I gave the pt instructions to keep the left hand elevated above the level of the heart.  The patient will follow up with Dr.  Darrick Penna at the scheduled appointment.    Leonides Sake, MD [facility] [place] Office: [phone] Pager: [phone]

## 2012-05-15 DIAGNOSIS — I6523 Occlusion and stenosis of bilateral carotid arteries: Secondary | ICD-10-CM | POA: Insufficient documentation

## 2012-05-19 ENCOUNTER — Other Ambulatory Visit: Payer: Self-pay | Admitting: Cardiology

## 2012-05-25 ENCOUNTER — Institutional Professional Consult (permissible substitution): Payer: Medicare Other | Admitting: Pulmonary Disease

## 2012-05-30 ENCOUNTER — Encounter (HOSPITAL_COMMUNITY)
Admission: RE | Admit: 2012-05-30 | Discharge: 2012-05-30 | Disposition: A | Discharge status: Home / Self Care | Payer: Medicare Other | Source: Ambulatory Visit | Attending: Nephrology | Admitting: Nephrology

## 2012-05-30 DIAGNOSIS — D638 Anemia in other chronic diseases classified elsewhere: Secondary | ICD-10-CM | POA: Insufficient documentation

## 2012-05-30 DIAGNOSIS — N183 Chronic kidney disease, stage 3 unspecified: Secondary | ICD-10-CM | POA: Insufficient documentation

## 2012-05-30 MED ORDER — EPOETIN ALFA 20000 UNIT/ML IJ SOLN
INTRAMUSCULAR | Status: AC
  Administered 2012-05-30: 20000 [IU] via SUBCUTANEOUS
  Filled 2012-05-30: qty 1

## 2012-05-30 MED ORDER — EPOETIN ALFA 10000 UNIT/ML IJ SOLN
INTRAMUSCULAR | Status: AC
  Filled 2012-05-30: qty 1

## 2012-05-30 MED ORDER — EPOETIN ALFA 10000 UNIT/ML IJ SOLN
30000.0000 [IU] | INTRAMUSCULAR | Status: DC
  Administered 2012-05-30: 10000 [IU] via SUBCUTANEOUS

## 2012-06-06 ENCOUNTER — Encounter: Payer: Self-pay | Admitting: Vascular Surgery

## 2012-06-07 ENCOUNTER — Ambulatory Visit: Payer: Medicare Other | Admitting: Vascular Surgery

## 2012-06-13 ENCOUNTER — Inpatient Hospital Stay (HOSPITAL_COMMUNITY): Admission: RE | Admit: 2012-06-13 | Payer: Medicare Other | Source: Ambulatory Visit

## 2012-06-20 ENCOUNTER — Encounter: Payer: Self-pay | Admitting: Vascular Surgery

## 2012-06-21 ENCOUNTER — Ambulatory Visit: Payer: Medicare Other | Admitting: Vascular Surgery

## 2012-06-21 ENCOUNTER — Ambulatory Visit (INDEPENDENT_AMBULATORY_CARE_PROVIDER_SITE_OTHER): Payer: Medicare Other | Admitting: Vascular Surgery

## 2012-06-21 ENCOUNTER — Encounter: Payer: Self-pay | Admitting: Vascular Surgery

## 2012-06-21 VITALS — BP 142/59 | HR 56 | Temp 97.8°F | Ht 71.0 in | Wt 203.0 lb

## 2012-06-21 DIAGNOSIS — N186 End stage renal disease: Secondary | ICD-10-CM

## 2012-06-21 NOTE — Progress Notes (Signed)
Patient is a 66 year old male who recently underwent left radiocephalic AV fistula placement on 05/07/2012. He returns today for followup. He currently is not on dialysis. He denies any numbness or tingling in the left hand. He still has some swelling around the incision. He has no drainage.  Physical exam: Filed Vitals:   06/21/12 1131  BP: 142/59  Pulse: 56  Temp: 97.8 F (36.6 C)  TempSrc: Oral  Height: 5\' 11"  (1.803 m)  Weight: 203 lb (92.08 kg)   Left upper extremity: Palpable thrill in fistula the vein is visible and developed well throughout the entire forearm. There is a 2 cm nodule at the level of the skin incision which may represent a small hematoma. This does not appear to be a pseudoaneurysm.  Assessment: Developing left arm AV fistula  Plan: Followup on as-needed basis.  Fabienne Bruns, MD [facility] [place] Office: [phone] Pager: [phone]

## 2012-06-29 ENCOUNTER — Other Ambulatory Visit: Payer: Self-pay | Admitting: Cardiology

## 2012-07-28 ENCOUNTER — Other Ambulatory Visit: Payer: Self-pay | Admitting: Cardiology

## 2012-07-29 ENCOUNTER — Other Ambulatory Visit: Payer: Self-pay | Admitting: Pulmonary Disease

## 2012-07-29 DIAGNOSIS — G4733 Obstructive sleep apnea (adult) (pediatric): Secondary | ICD-10-CM

## 2012-08-08 DIAGNOSIS — I358 Other nonrheumatic aortic valve disorders: Secondary | ICD-10-CM | POA: Insufficient documentation

## 2012-08-08 DIAGNOSIS — R0989 Other specified symptoms and signs involving the circulatory and respiratory systems: Secondary | ICD-10-CM | POA: Insufficient documentation

## 2012-08-08 DIAGNOSIS — I517 Cardiomegaly: Secondary | ICD-10-CM | POA: Insufficient documentation

## 2012-08-16 ENCOUNTER — Telehealth: Payer: Self-pay | Admitting: Pulmonary Disease

## 2012-08-16 NOTE — Telephone Encounter (Signed)
lmomtcb x1 for pt to see why he has not took his machine in yet

## 2012-08-17 NOTE — Telephone Encounter (Signed)
I spoke with the pt and he states he has been out of town due to family death and will take his machine by today. Carron Curie, CMA

## 2012-08-25 ENCOUNTER — Other Ambulatory Visit: Payer: Self-pay | Admitting: Cardiology

## 2012-09-04 ENCOUNTER — Other Ambulatory Visit: Payer: Self-pay | Admitting: Cardiology

## 2012-10-16 DIAGNOSIS — N35919 Unspecified urethral stricture, male, unspecified site: Secondary | ICD-10-CM | POA: Insufficient documentation

## 2012-12-12 ENCOUNTER — Other Ambulatory Visit (HOSPITAL_COMMUNITY): Payer: Self-pay | Admitting: *Deleted

## 2012-12-13 ENCOUNTER — Encounter (HOSPITAL_COMMUNITY)
Admission: RE | Admit: 2012-12-13 | Discharge: 2012-12-13 | Disposition: A | Discharge status: Home / Self Care | Payer: Medicare Other | Source: Ambulatory Visit | Attending: Nephrology | Admitting: Nephrology

## 2012-12-13 DIAGNOSIS — N186 End stage renal disease: Secondary | ICD-10-CM | POA: Insufficient documentation

## 2012-12-13 LAB — FERRITIN: Ferritin: 236 ng/mL (ref 22–322)

## 2012-12-13 LAB — RENAL FUNCTION PANEL
BUN: 62 mg/dL — ABNORMAL HIGH (ref 6–23)
CO2: 27 mEq/L (ref 19–32)
Calcium: 7.9 mg/dL — ABNORMAL LOW (ref 8.4–10.5)
GFR calc Af Amer: 9 mL/min — ABNORMAL LOW (ref >90)
Glucose, Bld: 233 mg/dL — ABNORMAL HIGH (ref 70–99)
Potassium: 4.1 mEq/L (ref 3.5–5.1)
Sodium: 139 mEq/L (ref 135–145)

## 2012-12-13 LAB — IRON AND TIBC
Iron: 38 ug/dL — ABNORMAL LOW (ref 42–135)
Saturation Ratios: 16 % — ABNORMAL LOW (ref 20–55)
UIBC: 206 ug/dL (ref 125–400)

## 2012-12-13 MED ORDER — EPOETIN ALFA 10000 UNIT/ML IJ SOLN
INTRAMUSCULAR | Status: AC
  Administered 2012-12-13: 10000 [IU] via SUBCUTANEOUS
  Filled 2012-12-13: qty 1

## 2012-12-13 MED ORDER — EPOETIN ALFA 20000 UNIT/ML IJ SOLN
INTRAMUSCULAR | Status: AC
  Administered 2012-12-13: 20000 [IU] via SUBCUTANEOUS
  Filled 2012-12-13: qty 1

## 2012-12-13 MED ORDER — EPOETIN ALFA 10000 UNIT/ML IJ SOLN: 30000.0000 [IU] | INTRAMUSCULAR | Status: DC

## 2012-12-13 NOTE — Progress Notes (Signed)
Crystal called back. Hgb results given. Instructed to give injection as ordered and remind patient to keep his injection schedule. Patient verbalizes understanding.

## 2012-12-13 NOTE — Progress Notes (Signed)
Patient has not been seen here since June 2013. Notified Crystal at [facility] that he is here today and we have orders from Oct 2013. Instructed to use October orders. Patient had hemoglobin of 7.3 at recent office visit per Crystal. Patient states that he feels fine and has not been here because he was working. Hemocue obtained and resulted at 7.4. Called [facility]. Crystal to call back.

## 2012-12-27 ENCOUNTER — Encounter (HOSPITAL_COMMUNITY)
Admission: RE | Admit: 2012-12-27 | Discharge: 2012-12-27 | Disposition: A | Discharge status: Home / Self Care | Payer: Medicare Other | Source: Ambulatory Visit | Attending: Nephrology | Admitting: Nephrology

## 2012-12-27 DIAGNOSIS — N186 End stage renal disease: Secondary | ICD-10-CM | POA: Insufficient documentation

## 2012-12-27 MED ORDER — EPOETIN ALFA 10000 UNIT/ML IJ SOLN: 30000.0000 [IU] | INTRAMUSCULAR | Status: DC

## 2012-12-27 NOTE — Progress Notes (Signed)
Patient's blood pressure was not within parameters today. He stated that he has not taken his blood pressure meds today and he wants to reschedule. I called [facility] and spoke with Joann. Reported above as well as the fact that we do not have an open injection appointment until Jan 7th. Reported the hgb (7.4) from his last visit on Dec. 19th. Asked it is okay for him to wait until Jan 7th or whether they wanted to give [place] a Clonidine order for today. Was told it is ok for patient to wait until Jan 7th. Patient scheduled and aware.

## 2012-12-31 ENCOUNTER — Other Ambulatory Visit (HOSPITAL_COMMUNITY): Payer: Self-pay | Admitting: *Deleted

## 2013-01-01 ENCOUNTER — Encounter (HOSPITAL_COMMUNITY)
Admission: RE | Admit: 2013-01-01 | Discharge: 2013-01-01 | Disposition: A | Discharge status: Home / Self Care | Payer: Medicare Other | Source: Ambulatory Visit | Attending: Nephrology | Admitting: Nephrology

## 2013-01-01 LAB — RENAL FUNCTION PANEL
Albumin: 3.7 g/dL (ref 3.5–5.2)
BUN: 68 mg/dL — ABNORMAL HIGH (ref 6–23)
CO2: 26 mEq/L (ref 19–32)
Chloride: 100 mEq/L (ref 96–112)
GFR calc non Af Amer: 8 mL/min — ABNORMAL LOW (ref >90)
Potassium: 4.3 mEq/L (ref 3.5–5.1)

## 2013-01-01 LAB — POCT HEMOGLOBIN-HEMACUE: Hemoglobin: 7.7 g/dL — ABNORMAL LOW (ref 13.0–17.0)

## 2013-01-01 MED ORDER — SODIUM CHLORIDE 0.9 % IV SOLN
INTRAVENOUS | Status: DC
  Administered 2013-01-01: 14:00:00 via INTRAVENOUS

## 2013-01-01 MED ORDER — EPOETIN ALFA 20000 UNIT/ML IJ SOLN
INTRAMUSCULAR | Status: AC
  Administered 2013-01-01: 20000 [IU] via SUBCUTANEOUS
  Filled 2013-01-01: qty 1

## 2013-01-01 MED ORDER — EPOETIN ALFA 10000 UNIT/ML IJ SOLN: 30000.0000 [IU] | INTRAMUSCULAR | Status: DC

## 2013-01-01 MED ORDER — FERUMOXYTOL INJECTION 510 MG/17 ML
INTRAVENOUS | Status: AC
  Administered 2013-01-01: 510 mg
  Filled 2013-01-01: qty 17

## 2013-01-01 MED ORDER — EPOETIN ALFA 10000 UNIT/ML IJ SOLN
INTRAMUSCULAR | Status: AC
  Administered 2013-01-01: 10000 [IU] via SUBCUTANEOUS
  Filled 2013-01-01: qty 1

## 2013-01-01 MED ORDER — FERUMOXYTOL INJECTION 510 MG/17 ML: 510.0000 mg | INTRAVENOUS | Status: DC

## 2013-01-01 NOTE — Progress Notes (Signed)
Patient denies any dizziness or light headedness. Not orthostatic

## 2013-01-02 LAB — PTH, INTACT AND CALCIUM
Calcium, Total (PTH): 7.8 mg/dL — ABNORMAL LOW (ref 8.4–10.5)
PTH: 500 pg/mL — ABNORMAL HIGH (ref 14.0–72.0)

## 2013-01-15 ENCOUNTER — Encounter (HOSPITAL_COMMUNITY)
Admission: RE | Admit: 2013-01-15 | Discharge: 2013-01-15 | Disposition: A | Discharge status: Home / Self Care | Payer: Medicare Other | Source: Ambulatory Visit | Attending: Nephrology | Admitting: Nephrology

## 2013-01-15 LAB — IRON AND TIBC: TIBC: 227 ug/dL (ref 215–435)

## 2013-01-15 MED ORDER — FERUMOXYTOL INJECTION 510 MG/17 ML
510.0000 mg | INTRAVENOUS | Status: AC
  Administered 2013-01-15: 510 mg via INTRAVENOUS

## 2013-01-15 MED ORDER — EPOETIN ALFA 20000 UNIT/ML IJ SOLN
INTRAMUSCULAR | Status: AC
  Administered 2013-01-15: 20000 [IU] via SUBCUTANEOUS
  Filled 2013-01-15: qty 1

## 2013-01-15 MED ORDER — SODIUM CHLORIDE 0.9 % IV SOLN
INTRAVENOUS | Status: AC
Start: 2013-01-15 — End: 2013-01-15
  Administered 2013-01-15: 250 mL via INTRAVENOUS

## 2013-01-15 MED ORDER — FERUMOXYTOL INJECTION 510 MG/17 ML
INTRAVENOUS | Status: AC
  Administered 2013-01-15: 510 mg via INTRAVENOUS
  Filled 2013-01-15: qty 17

## 2013-01-15 MED ORDER — EPOETIN ALFA 10000 UNIT/ML IJ SOLN: 30000.0000 [IU] | INTRAMUSCULAR | Status: DC

## 2013-01-15 MED ORDER — EPOETIN ALFA 10000 UNIT/ML IJ SOLN
INTRAMUSCULAR | Status: AC
  Administered 2013-01-15: 10000 [IU] via SUBCUTANEOUS
  Filled 2013-01-15: qty 1

## 2013-01-28 ENCOUNTER — Other Ambulatory Visit (HOSPITAL_COMMUNITY): Payer: Self-pay | Admitting: *Deleted

## 2013-01-29 ENCOUNTER — Encounter (HOSPITAL_COMMUNITY)
Admission: RE | Admit: 2013-01-29 | Discharge: 2013-01-29 | Disposition: A | Discharge status: Home / Self Care | Payer: Medicare Other | Source: Ambulatory Visit | Attending: Nephrology | Admitting: Nephrology

## 2013-01-29 DIAGNOSIS — N186 End stage renal disease: Secondary | ICD-10-CM | POA: Insufficient documentation

## 2013-01-29 LAB — RENAL FUNCTION PANEL
Albumin: 3.5 g/dL (ref 3.5–5.2)
BUN: 81 mg/dL — ABNORMAL HIGH (ref 6–23)
CO2: 25 mEq/L (ref 19–32)
Chloride: 95 mEq/L — ABNORMAL LOW (ref 96–112)
Creatinine, Ser: 7.86 mg/dL — ABNORMAL HIGH (ref 0.50–1.35)
GFR calc non Af Amer: 6 mL/min — ABNORMAL LOW (ref >90)
Potassium: 4.3 mEq/L (ref 3.5–5.1)

## 2013-01-29 LAB — POCT HEMOGLOBIN-HEMACUE: Hemoglobin: 9.9 g/dL — ABNORMAL LOW (ref 13.0–17.0)

## 2013-01-29 MED ORDER — EPOETIN ALFA 10000 UNIT/ML IJ SOLN
30000.0000 [IU] | INTRAMUSCULAR | Status: DC
  Administered 2013-01-29: 30000 [IU] via SUBCUTANEOUS

## 2013-01-29 MED ORDER — EPOETIN ALFA 20000 UNIT/ML IJ SOLN
INTRAMUSCULAR | Status: AC
  Filled 2013-01-29: qty 1

## 2013-01-29 MED ORDER — EPOETIN ALFA 10000 UNIT/ML IJ SOLN
INTRAMUSCULAR | Status: AC
  Filled 2013-01-29: qty 1

## 2013-01-29 MED ORDER — FERUMOXYTOL INJECTION 510 MG/17 ML
INTRAVENOUS | Status: AC
  Administered 2013-01-29: 510 mg via INTRAVENOUS
  Filled 2013-01-29: qty 17

## 2013-01-29 MED ORDER — SODIUM CHLORIDE 0.9 % IV SOLN
INTRAVENOUS | Status: DC
  Administered 2013-01-29: 14:00:00 via INTRAVENOUS

## 2013-01-29 MED ORDER — FERUMOXYTOL INJECTION 510 MG/17 ML
510.0000 mg | INTRAVENOUS | Status: DC
  Administered 2013-01-29: 510 mg via INTRAVENOUS

## 2013-01-29 MED ORDER — CLONIDINE HCL 0.1 MG PO TABS: 0.1000 mg | ORAL_TABLET | ORAL | Status: DC | PRN

## 2013-01-30 MED FILL — Epoetin Alfa Inj 20000 Unit/ML: INTRAMUSCULAR | Qty: 1 | Status: AC

## 2013-01-30 MED FILL — Epoetin Alfa Inj 10000 Unit/ML: INTRAMUSCULAR | Qty: 1 | Status: AC

## 2013-02-02 ENCOUNTER — Emergency Department (HOSPITAL_COMMUNITY): Payer: Medicare Other

## 2013-02-02 ENCOUNTER — Emergency Department (HOSPITAL_COMMUNITY)
Admission: EM | Admit: 2013-02-02 | Discharge: 2013-02-02 | Disposition: A | Discharge status: Home / Self Care | Payer: Medicare Other | Attending: Emergency Medicine | Admitting: Emergency Medicine

## 2013-02-02 DIAGNOSIS — K219 Gastro-esophageal reflux disease without esophagitis: Secondary | ICD-10-CM | POA: Insufficient documentation

## 2013-02-02 DIAGNOSIS — G4733 Obstructive sleep apnea (adult) (pediatric): Secondary | ICD-10-CM | POA: Insufficient documentation

## 2013-02-02 DIAGNOSIS — N189 Chronic kidney disease, unspecified: Secondary | ICD-10-CM

## 2013-02-02 DIAGNOSIS — E669 Obesity, unspecified: Secondary | ICD-10-CM | POA: Insufficient documentation

## 2013-02-02 DIAGNOSIS — E079 Disorder of thyroid, unspecified: Secondary | ICD-10-CM | POA: Insufficient documentation

## 2013-02-02 DIAGNOSIS — Z8719 Personal history of other diseases of the digestive system: Secondary | ICD-10-CM | POA: Insufficient documentation

## 2013-02-02 DIAGNOSIS — N179 Acute kidney failure, unspecified: Secondary | ICD-10-CM | POA: Insufficient documentation

## 2013-02-02 DIAGNOSIS — Z87448 Personal history of other diseases of urinary system: Secondary | ICD-10-CM | POA: Insufficient documentation

## 2013-02-02 DIAGNOSIS — R011 Cardiac murmur, unspecified: Secondary | ICD-10-CM | POA: Insufficient documentation

## 2013-02-02 DIAGNOSIS — Z9889 Other specified postprocedural states: Secondary | ICD-10-CM | POA: Insufficient documentation

## 2013-02-02 DIAGNOSIS — E785 Hyperlipidemia, unspecified: Secondary | ICD-10-CM | POA: Insufficient documentation

## 2013-02-02 DIAGNOSIS — E119 Type 2 diabetes mellitus without complications: Secondary | ICD-10-CM | POA: Insufficient documentation

## 2013-02-02 DIAGNOSIS — Z872 Personal history of diseases of the skin and subcutaneous tissue: Secondary | ICD-10-CM | POA: Insufficient documentation

## 2013-02-02 DIAGNOSIS — Z79899 Other long term (current) drug therapy: Secondary | ICD-10-CM | POA: Insufficient documentation

## 2013-02-02 DIAGNOSIS — Z860100 Personal history of colon polyps, unspecified: Secondary | ICD-10-CM | POA: Insufficient documentation

## 2013-02-02 DIAGNOSIS — I129 Hypertensive chronic kidney disease with stage 1 through stage 4 chronic kidney disease, or unspecified chronic kidney disease: Secondary | ICD-10-CM | POA: Insufficient documentation

## 2013-02-02 DIAGNOSIS — I509 Heart failure, unspecified: Secondary | ICD-10-CM | POA: Insufficient documentation

## 2013-02-02 DIAGNOSIS — B349 Viral infection, unspecified: Secondary | ICD-10-CM

## 2013-02-02 DIAGNOSIS — I2510 Atherosclerotic heart disease of native coronary artery without angina pectoris: Secondary | ICD-10-CM | POA: Insufficient documentation

## 2013-02-02 DIAGNOSIS — Z87891 Personal history of nicotine dependence: Secondary | ICD-10-CM | POA: Insufficient documentation

## 2013-02-02 DIAGNOSIS — R05 Cough: Secondary | ICD-10-CM | POA: Insufficient documentation

## 2013-02-02 DIAGNOSIS — Z8619 Personal history of other infectious and parasitic diseases: Secondary | ICD-10-CM | POA: Insufficient documentation

## 2013-02-02 DIAGNOSIS — Z87828 Personal history of other (healed) physical injury and trauma: Secondary | ICD-10-CM | POA: Insufficient documentation

## 2013-02-02 DIAGNOSIS — R5381 Other malaise: Secondary | ICD-10-CM | POA: Insufficient documentation

## 2013-02-02 DIAGNOSIS — Z8709 Personal history of other diseases of the respiratory system: Secondary | ICD-10-CM | POA: Insufficient documentation

## 2013-02-02 DIAGNOSIS — B9789 Other viral agents as the cause of diseases classified elsewhere: Secondary | ICD-10-CM | POA: Insufficient documentation

## 2013-02-02 DIAGNOSIS — Z7982 Long term (current) use of aspirin: Secondary | ICD-10-CM | POA: Insufficient documentation

## 2013-02-02 DIAGNOSIS — R059 Cough, unspecified: Secondary | ICD-10-CM | POA: Insufficient documentation

## 2013-02-02 DIAGNOSIS — Z794 Long term (current) use of insulin: Secondary | ICD-10-CM | POA: Insufficient documentation

## 2013-02-02 DIAGNOSIS — Z862 Personal history of diseases of the blood and blood-forming organs and certain disorders involving the immune mechanism: Secondary | ICD-10-CM | POA: Insufficient documentation

## 2013-02-02 LAB — CBC WITH DIFFERENTIAL/PLATELET
Basophils Absolute: 0 10*3/uL (ref 0.0–0.1)
HCT: 30.1 % — ABNORMAL LOW (ref 39.0–52.0)
Lymphocytes Relative: 14 % (ref 12–46)
Lymphs Abs: 1 10*3/uL (ref 0.7–4.0)
Neutro Abs: 5.4 10*3/uL (ref 1.7–7.7)
Platelets: 250 10*3/uL (ref 150–400)
RBC: 3.74 MIL/uL — ABNORMAL LOW (ref 4.22–5.81)
RDW: 16.7 % — ABNORMAL HIGH (ref 11.5–15.5)
WBC: 7.4 10*3/uL (ref 4.0–10.5)

## 2013-02-02 LAB — URINALYSIS, ROUTINE W REFLEX MICROSCOPIC
Bilirubin Urine: NEGATIVE
Ketones, ur: NEGATIVE mg/dL
Protein, ur: >300 mg/dL — AB
Urobilinogen, UA: 1 mg/dL (ref 0.0–1.0)

## 2013-02-02 LAB — COMPREHENSIVE METABOLIC PANEL
ALT: 9 U/L (ref 0–53)
AST: 13 U/L (ref 0–37)
Alkaline Phosphatase: 40 U/L (ref 39–117)
CO2: 25 mEq/L (ref 19–32)
Chloride: 91 mEq/L — ABNORMAL LOW (ref 96–112)
GFR calc non Af Amer: 6 mL/min — ABNORMAL LOW (ref >90)
Glucose, Bld: 240 mg/dL — ABNORMAL HIGH (ref 70–99)
Sodium: 130 mEq/L — ABNORMAL LOW (ref 135–145)
Total Bilirubin: 0.3 mg/dL (ref 0.3–1.2)

## 2013-02-02 LAB — URINE MICROSCOPIC-ADD ON

## 2013-02-02 MED ORDER — ALBUTEROL SULFATE (5 MG/ML) 0.5% IN NEBU
5.0000 mg | INHALATION_SOLUTION | Freq: Once | RESPIRATORY_TRACT | Status: AC
  Administered 2013-02-02: 5 mg via RESPIRATORY_TRACT
  Filled 2013-02-02: qty 1

## 2013-02-02 MED ORDER — IPRATROPIUM BROMIDE 0.02 % IN SOLN
0.5000 mg | Freq: Once | RESPIRATORY_TRACT | Status: AC
  Administered 2013-02-02: 0.5 mg via RESPIRATORY_TRACT
  Filled 2013-02-02: qty 2.5

## 2013-02-02 MED ORDER — ALBUTEROL SULFATE HFA 108 (90 BASE) MCG/ACT IN AERS: 1.0000 | INHALATION_SPRAY | Freq: Four times a day (QID) | RESPIRATORY_TRACT | Status: DC | PRN

## 2013-02-02 MED ORDER — SODIUM CHLORIDE 0.9 % IV BOLUS (SEPSIS)
500.0000 mL | Freq: Once | INTRAVENOUS | Status: AC
  Administered 2013-02-02: 500 mL via INTRAVENOUS

## 2013-02-02 MED ORDER — SODIUM CHLORIDE 0.9 % IV SOLN: Freq: Once | INTRAVENOUS | Status: DC

## 2013-02-02 NOTE — ED Notes (Signed)
Pt verbalizes understanding 

## 2013-02-02 NOTE — ED Notes (Signed)
Pt presents w/ flu like Sx for 2 wks. Called PCP who suggested pt come to ED for evaluation. Pt has cough, congestion, hoarseness. Productive cough, dark green in color. Pt extremely weak and has spent last wk in bed. Pt is on kidney transplant list.
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
## 2013-02-04 LAB — URINE CULTURE: Colony Count: >=100000

## 2013-02-05 IMAGING — CR DG CHEST 2V
2 series · 2 of 2 positions shown · non-contrast
Comparison: 01/07/2012

CLINICAL DATA: Preop

CHEST - 2 VIEW

[view not recorded (1 of 2)]
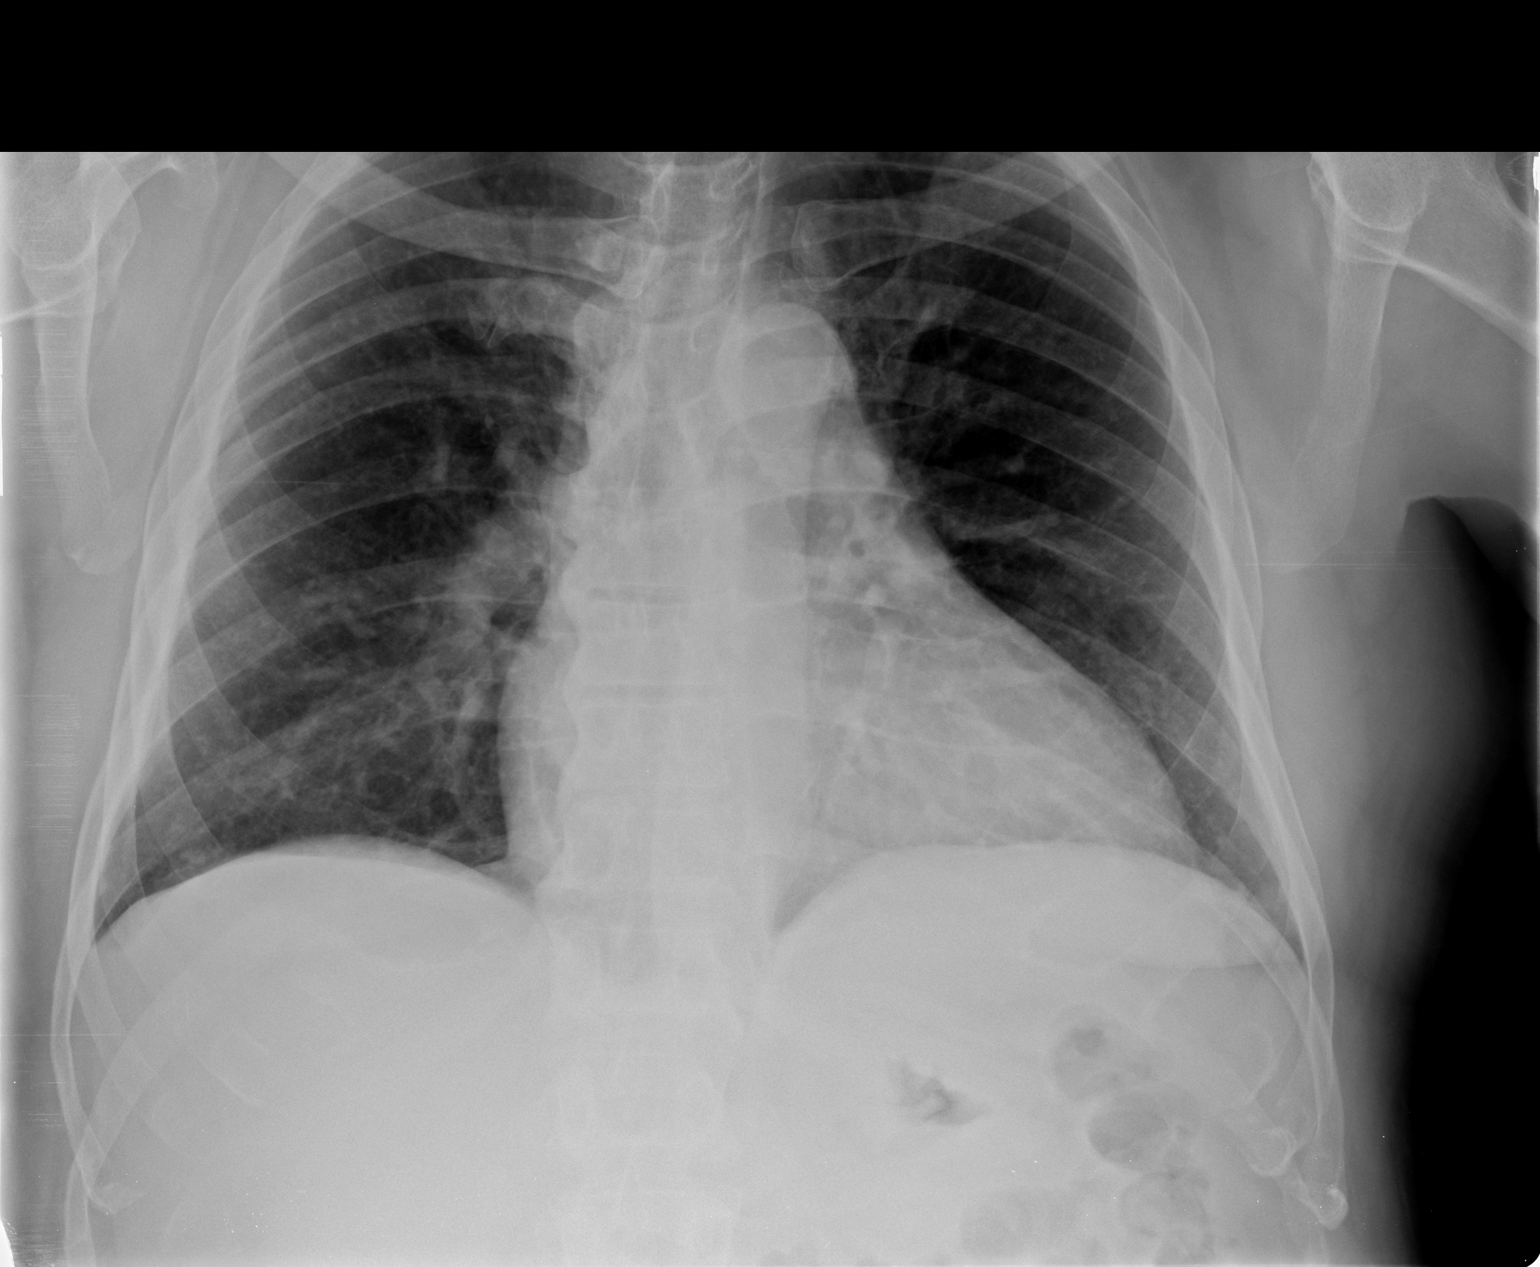

[view not recorded (2 of 2)]
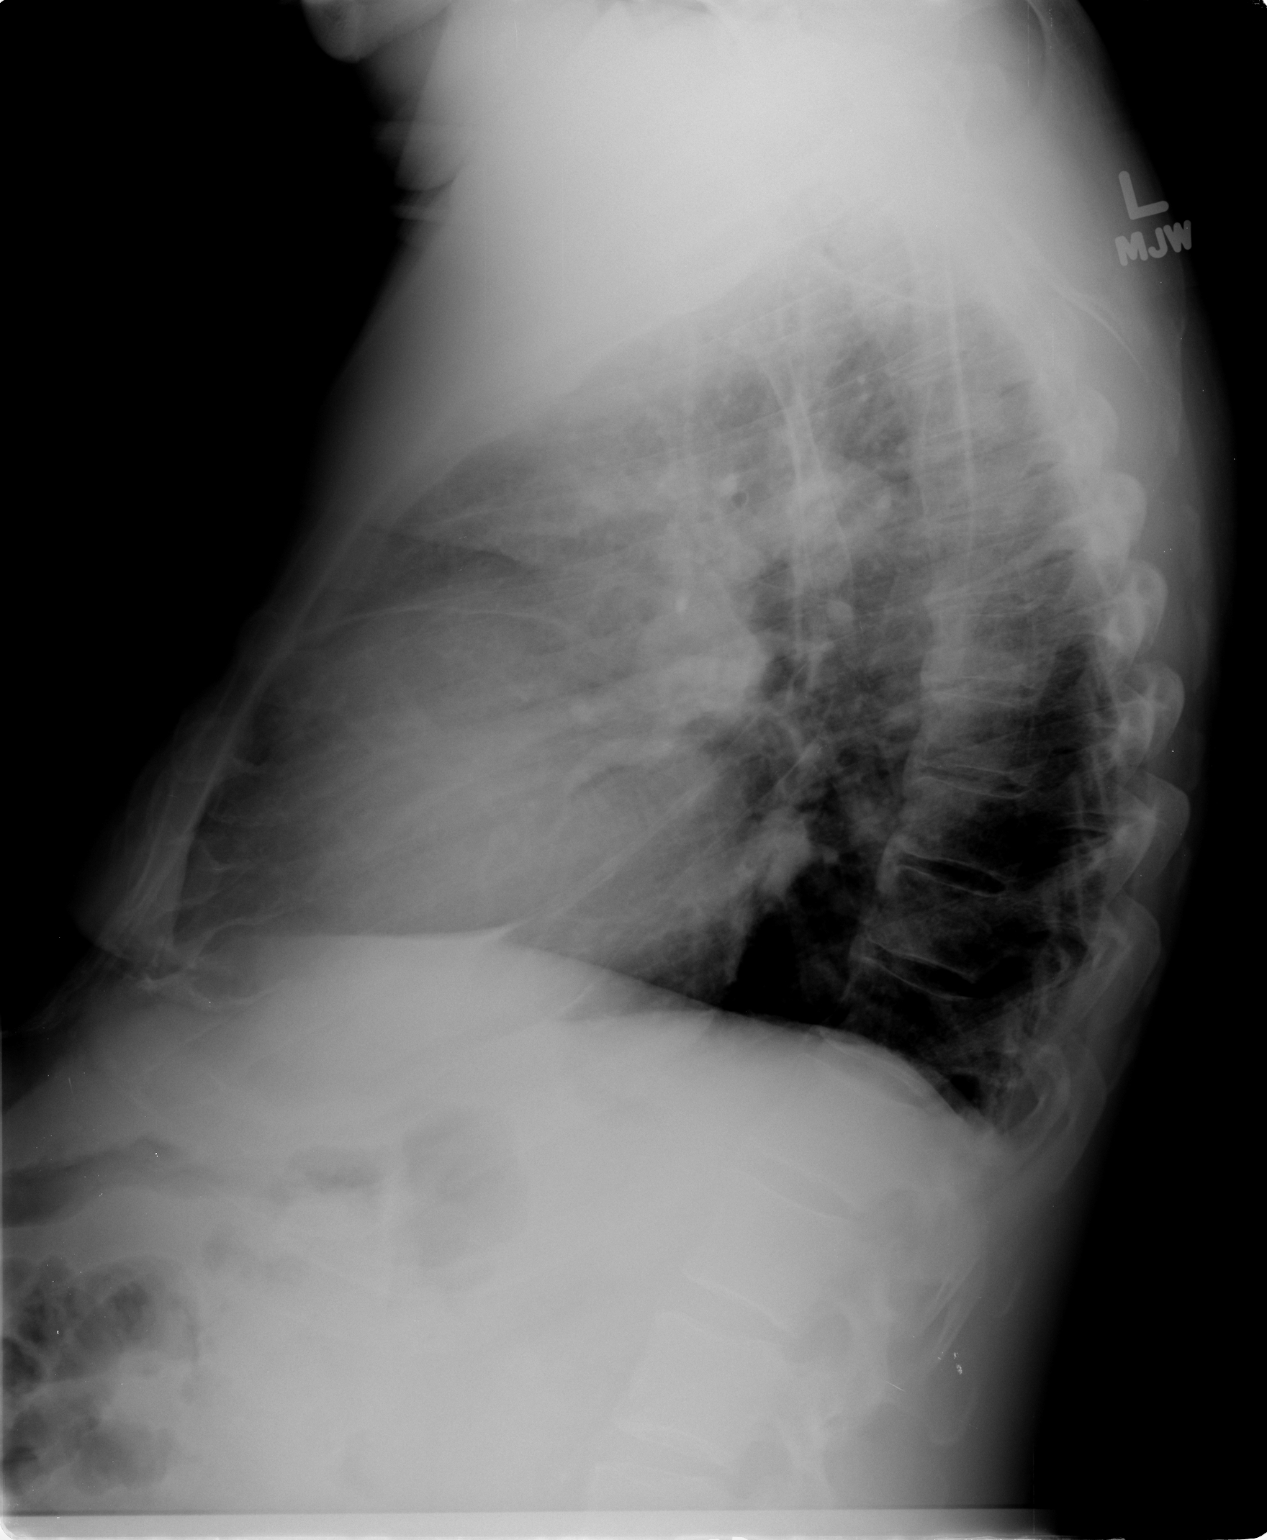

[2 of 2 positions shown; findings below may reference images not displayed]

FINDINGS: Borderline cardiomegaly again noted.  No acute infiltrate
or pleural effusion.  No pulmonary edema.  Stable mild degenerative
changes thoracic spine.
IMPRESSION: No active disease.  No significant change.

## 2013-02-05 NOTE — ED Notes (Signed)
+   urine Chart sent to EDP office for review. 

## 2013-02-10 ENCOUNTER — Telehealth (HOSPITAL_COMMUNITY): Payer: Self-pay | Admitting: Emergency Medicine

## 2013-02-10 NOTE — ED Notes (Signed)
Chart returned from EDP office. Prescribed Bactrim DS. 1 tab PO BID x 7 days. #14. Prescribed by Burgess Amor PA-C.

## 2013-02-10 NOTE — ED Notes (Signed)
Rx called in to [facility] [phone]) by Norm Parcel PFM.

## 2013-02-12 ENCOUNTER — Encounter (HOSPITAL_COMMUNITY)
Admission: RE | Admit: 2013-02-12 | Discharge: 2013-02-12 | Disposition: A | Discharge status: Home / Self Care | Payer: Medicare Other | Source: Ambulatory Visit | Attending: Nephrology | Admitting: Nephrology

## 2013-02-12 LAB — IRON AND TIBC
Iron: 66 ug/dL (ref 42–135)
TIBC: 172 ug/dL — ABNORMAL LOW (ref 215–435)

## 2013-02-12 MED ORDER — SODIUM CHLORIDE 0.9 % IV SOLN: INTRAVENOUS | Status: DC

## 2013-02-12 MED ORDER — FERUMOXYTOL INJECTION 510 MG/17 ML: 510.0000 mg | INTRAVENOUS | Status: DC

## 2013-02-12 MED ORDER — EPOETIN ALFA 20000 UNIT/ML IJ SOLN
INTRAMUSCULAR | Status: AC
  Administered 2013-02-12: 20000 [IU] via SUBCUTANEOUS
  Filled 2013-02-12: qty 1

## 2013-02-12 MED ORDER — FERUMOXYTOL INJECTION 510 MG/17 ML
INTRAVENOUS | Status: AC
  Administered 2013-02-12: 510 mg
  Filled 2013-02-12: qty 17

## 2013-02-12 MED ORDER — EPOETIN ALFA 10000 UNIT/ML IJ SOLN
INTRAMUSCULAR | Status: AC
  Administered 2013-02-12: 10000 [IU] via SUBCUTANEOUS
  Filled 2013-02-12: qty 1

## 2013-02-12 MED ORDER — EPOETIN ALFA 10000 UNIT/ML IJ SOLN: 30000.0000 [IU] | INTRAMUSCULAR | Status: DC

## 2013-02-23 DIAGNOSIS — Z940 Kidney transplant status: Secondary | ICD-10-CM

## 2013-02-23 HISTORY — DX: Kidney transplant status: Z94.0

## 2013-02-26 ENCOUNTER — Encounter (HOSPITAL_COMMUNITY): Payer: Medicare Other

## 2013-03-01 ENCOUNTER — Encounter (HOSPITAL_COMMUNITY): Payer: Medicare Other

## 2013-03-01 ENCOUNTER — Inpatient Hospital Stay (HOSPITAL_COMMUNITY): Admission: RE | Admit: 2013-03-01 | Payer: Medicare Other | Source: Ambulatory Visit

## 2013-03-04 ENCOUNTER — Encounter (HOSPITAL_COMMUNITY)
Admission: RE | Admit: 2013-03-04 | Discharge: 2013-03-04 | Disposition: A | Discharge status: Home / Self Care | Payer: Medicare Other | Source: Ambulatory Visit | Attending: Nephrology | Admitting: Nephrology

## 2013-03-04 DIAGNOSIS — N186 End stage renal disease: Secondary | ICD-10-CM | POA: Insufficient documentation

## 2013-03-04 LAB — RENAL FUNCTION PANEL
BUN: 64 mg/dL — ABNORMAL HIGH (ref 6–23)
CO2: 26 mEq/L (ref 19–32)
Chloride: 98 mEq/L (ref 96–112)
Creatinine, Ser: 5.29 mg/dL — ABNORMAL HIGH (ref 0.50–1.35)

## 2013-03-04 MED ORDER — EPOETIN ALFA 10000 UNIT/ML IJ SOLN
INTRAMUSCULAR | Status: AC
  Administered 2013-03-04: 10000 [IU] via SUBCUTANEOUS
  Filled 2013-03-04: qty 1

## 2013-03-04 MED ORDER — EPOETIN ALFA 20000 UNIT/ML IJ SOLN
INTRAMUSCULAR | Status: AC
  Administered 2013-03-04: 20000 [IU] via SUBCUTANEOUS
  Filled 2013-03-04: qty 1

## 2013-03-04 MED ORDER — CLONIDINE HCL 0.1 MG PO TABS: 0.1000 mg | ORAL_TABLET | ORAL | Status: DC | PRN

## 2013-03-04 MED ORDER — EPOETIN ALFA 10000 UNIT/ML IJ SOLN: 30000.0000 [IU] | INTRAMUSCULAR | Status: DC

## 2013-03-06 DIAGNOSIS — K5903 Drug induced constipation: Secondary | ICD-10-CM | POA: Insufficient documentation

## 2013-03-06 DIAGNOSIS — Z86718 Personal history of other venous thrombosis and embolism: Secondary | ICD-10-CM | POA: Insufficient documentation

## 2013-03-21 DIAGNOSIS — K5900 Constipation, unspecified: Secondary | ICD-10-CM | POA: Insufficient documentation

## 2013-03-25 DIAGNOSIS — Z79899 Other long term (current) drug therapy: Secondary | ICD-10-CM | POA: Insufficient documentation

## 2013-04-01 DIAGNOSIS — Z940 Kidney transplant status: Secondary | ICD-10-CM | POA: Insufficient documentation

## 2013-05-06 ENCOUNTER — Encounter: Payer: Self-pay | Admitting: Pulmonary Disease

## 2013-05-06 ENCOUNTER — Ambulatory Visit (INDEPENDENT_AMBULATORY_CARE_PROVIDER_SITE_OTHER): Payer: Medicare Other | Admitting: Pulmonary Disease

## 2013-05-06 VITALS — BP 170/90 | HR 70 | Temp 97.0°F | Ht 71.0 in | Wt 187.2 lb

## 2013-05-06 DIAGNOSIS — G4733 Obstructive sleep apnea (adult) (pediatric): Secondary | ICD-10-CM

## 2013-05-06 NOTE — Progress Notes (Signed)
  Subjective:    Patient ID: Cameron [name], male    DOB: Mar 21, 1946, 67 y.o.   MRN: [id number]  HPI The patient comes in today for followup of his obstructive sleep apnea.  He has been wearing CPAP compliantly, and is having no issues with his mask fit or pressure.  He has lost 25 pounds since the last visit in preparation for his kidney transplant, which went very well.  He feels that he sleeps very well with CPAP, and denies any daytime alertness issues.   Review of Systems  Constitutional: Negative for fever and unexpected weight change.  HENT: Negative for ear pain, nosebleeds, congestion, sore throat, rhinorrhea, sneezing, trouble swallowing, dental problem, postnasal drip and sinus pressure.   Eyes: Negative for redness and itching.  Respiratory: Negative for cough, chest tightness, shortness of breath and wheezing.   Cardiovascular: Negative for palpitations and leg swelling.  Gastrointestinal: Negative for nausea and vomiting.  Genitourinary: Negative for dysuria.  Musculoskeletal: Negative for joint swelling.  Skin: Negative for rash.  Neurological: Negative for headaches.  Hematological: Does not bruise/bleed easily.  Psychiatric/Behavioral: Negative for dysphoric mood. The patient is not nervous/anxious.        Objective:   Physical Exam Well-developed male in no acute distress Nose without purulence or discharge noted No skin breakdown or pressure necrosis from the CPAP mask Neck without lymphadenopathy or thyromegaly Lower extremities without edema, no cyanosis Alert and oriented, moves all 4 extremities.  Does not appear to be sleepy.       Assessment & Plan:

## 2013-05-06 NOTE — Patient Instructions (Addendum)
Try sleeping off cpap for about a week to see how you do.  If your wife hears snoring and an abnormal breathing pattern, get back on cpap.  If she feels you are doing well, please call me so that we can do a home sleep test to see if you still have sleep apnea since your significant weight loss.  followup with me in one year.

## 2013-05-06 NOTE — Assessment & Plan Note (Signed)
The patient is doing very well on CPAP, and has lost considerable weight since the last visit.  He is down near his ideal body weight, and therefore this raises the question whether he may still have sleep apnea or not.  I would like for him to go without CPAP for a week and see how he does.  His wife does not hear her snoring or an abnormal breathing pattern during sleep, I would do a home sleep test to see if he still has sleep apnea.

## 2013-08-07 DIAGNOSIS — D702 Other drug-induced agranulocytosis: Secondary | ICD-10-CM | POA: Insufficient documentation

## 2014-03-07 DIAGNOSIS — B029 Zoster without complications: Secondary | ICD-10-CM | POA: Insufficient documentation

## 2014-04-01 DIAGNOSIS — E1121 Type 2 diabetes mellitus with diabetic nephropathy: Secondary | ICD-10-CM | POA: Insufficient documentation

## 2014-04-01 DIAGNOSIS — E118 Type 2 diabetes mellitus with unspecified complications: Secondary | ICD-10-CM | POA: Insufficient documentation

## 2014-05-06 ENCOUNTER — Ambulatory Visit: Payer: Medicare Other | Admitting: Pulmonary Disease

## 2014-05-07 ENCOUNTER — Ambulatory Visit: Payer: Medicare Other | Admitting: Pulmonary Disease

## 2014-05-08 ENCOUNTER — Ambulatory Visit (INDEPENDENT_AMBULATORY_CARE_PROVIDER_SITE_OTHER): Payer: Medicare Other | Admitting: Pulmonary Disease

## 2014-05-08 ENCOUNTER — Encounter: Payer: Self-pay | Admitting: Pulmonary Disease

## 2014-05-08 ENCOUNTER — Encounter (INDEPENDENT_AMBULATORY_CARE_PROVIDER_SITE_OTHER): Payer: Self-pay

## 2014-05-08 VITALS — BP 136/54 | HR 62 | Temp 97.6°F | Ht 71.0 in | Wt 197.8 lb

## 2014-05-08 DIAGNOSIS — G4733 Obstructive sleep apnea (adult) (pediatric): Secondary | ICD-10-CM

## 2014-05-08 NOTE — Assessment & Plan Note (Signed)
The patient is doing very well with CPAP, and feels that he is sleeping adequately at night with excellent daytime alertness. I have asked him to work on weight loss, and also to keep up with his mask changes and supplies.

## 2014-05-08 NOTE — Patient Instructions (Signed)
Continue with cpap, and keep up with mask changes and supplies. Work on weight loss followup with me again in one year.  

## 2014-05-08 NOTE — Progress Notes (Signed)
   Subjective:    Patient ID: Cameron [name], male    DOB: 1946-01-08, 68 y.o.   MRN: [id number]  HPI Patient comes in today for followup of his known obstructive sleep apnea. At the last visit, he lost considerable weight, and I have asked him to try coming off the device to see if he still required. The patient states that he still has significant snoring and sleep disruption, so he went back on CPAP consistently. He has since gained weight back from the last visit. He feels that he sleeps well with the CPAP, and is well rested in the mornings with excellent daytime alertness.   Review of Systems  Constitutional: Negative for fever and unexpected weight change.  HENT: Negative for congestion, dental problem, ear pain, nosebleeds, postnasal drip, rhinorrhea, sinus pressure, sneezing, sore throat and trouble swallowing.   Eyes: Negative for redness and itching.  Respiratory: Negative for cough, chest tightness, shortness of breath and wheezing.   Cardiovascular: Negative for palpitations and leg swelling.  Gastrointestinal: Negative for nausea and vomiting.  Genitourinary: Negative for dysuria.  Musculoskeletal: Negative for joint swelling.  Skin: Negative for rash.  Neurological: Negative for headaches.  Hematological: Does not bruise/bleed easily.  Psychiatric/Behavioral: Negative for dysphoric mood. The patient is not nervous/anxious.        Objective:   Physical Exam Overweight male in no acute distress Nose without purulence or discharge noted No skin breakdown or pressure necrosis from the CPAP Neck without lymphadenopathy or thyromegaly Lower extremities with mild edema, no cyanosis Alert and oriented, moves all 4 extremities.       Assessment & Plan:

## 2014-05-16 ENCOUNTER — Ambulatory Visit: Payer: Medicare Other | Admitting: Pulmonary Disease

## 2014-10-06 ENCOUNTER — Encounter (HOSPITAL_COMMUNITY)
Admission: RE | Admit: 2014-10-06 | Discharge: 2014-10-06 | Disposition: A | Discharge status: Home / Self Care | Payer: Medicare Other | Source: Ambulatory Visit | Attending: Ophthalmology | Admitting: Ophthalmology

## 2014-10-06 ENCOUNTER — Encounter (HOSPITAL_COMMUNITY): Payer: Self-pay

## 2014-10-06 DIAGNOSIS — E11329 Type 2 diabetes mellitus with mild nonproliferative diabetic retinopathy without macular edema: Secondary | ICD-10-CM | POA: Diagnosis not present

## 2014-10-06 DIAGNOSIS — Z940 Kidney transplant status: Secondary | ICD-10-CM | POA: Diagnosis not present

## 2014-10-06 DIAGNOSIS — H2511 Age-related nuclear cataract, right eye: Secondary | ICD-10-CM | POA: Diagnosis not present

## 2014-10-06 DIAGNOSIS — Z87891 Personal history of nicotine dependence: Secondary | ICD-10-CM | POA: Diagnosis not present

## 2014-10-06 DIAGNOSIS — D649 Anemia, unspecified: Secondary | ICD-10-CM | POA: Diagnosis not present

## 2014-10-06 DIAGNOSIS — H269 Unspecified cataract: Secondary | ICD-10-CM | POA: Diagnosis present

## 2014-10-06 DIAGNOSIS — I10 Essential (primary) hypertension: Secondary | ICD-10-CM | POA: Diagnosis not present

## 2014-10-06 LAB — CBC
HCT: 39 % (ref 39.0–52.0)
HEMOGLOBIN: 12.7 g/dL — AB (ref 13.0–17.0)
MCH: 26.9 pg (ref 26.0–34.0)
MCHC: 32.6 g/dL (ref 30.0–36.0)
MCV: 82.6 fL (ref 78.0–100.0)
Platelets: 139 10*3/uL — ABNORMAL LOW (ref 150–400)
RBC: 4.72 MIL/uL (ref 4.22–5.81)
RDW: 14.4 % (ref 11.5–15.5)
WBC: 3.4 10*3/uL — AB (ref 4.0–10.5)

## 2014-10-06 LAB — BASIC METABOLIC PANEL
Anion gap: 11 (ref 5–15)
BUN: 13 mg/dL (ref 6–23)
CALCIUM: 9 mg/dL (ref 8.4–10.5)
CO2: 28 mEq/L (ref 19–32)
CREATININE: 1.1 mg/dL (ref 0.50–1.35)
Chloride: 100 mEq/L (ref 96–112)
GFR calc Af Amer: 77 mL/min — ABNORMAL LOW (ref >90)
GFR, EST NON AFRICAN AMERICAN: 66 mL/min — AB (ref >90)
GLUCOSE: 200 mg/dL — AB (ref 70–99)
Potassium: 3.4 mEq/L — ABNORMAL LOW (ref 3.7–5.3)
Sodium: 139 mEq/L (ref 137–147)

## 2014-10-06 LAB — SURGICAL PCR SCREEN
MRSA, PCR: POSITIVE — AB
Staphylococcus aureus: POSITIVE — AB

## 2014-10-06 NOTE — Pre-Procedure Instructions (Signed)
Marsalis Isla PenceH Vondrasek  10/06/2014   Your procedure is scheduled on: Wednesday, October 08, 2014  [facility] at 6:30 AM.  Call this number if you have problems the morning of surgery: [phone]   Remember:   Do not eat food or drink liquids after midnight Tuesday, October 07, 2014   Take these medicines the morning of surgery with A SIP OF WATER: carvedilol (COREG),  doxazosin (CARDURA), cloNIDine (CATAPRES), isosorbide-hydrALAZINE (BIDIL), NIFEdipine (PROCARDIA XL/ADALAT-CC), pantoprazole (PROTONIX), PROGRAF, MYFORTIC),  gabapentin (NEURONTIN).   Do not wear jewelry, make-up or nail polish.  Do not wear lotions, powders, or perfumes. You may  not wear deodorant.  Do not shave 48 hours prior to surgery. Men may shave face and neck.  Do not bring valuables to the hospital.  [facility] is not responsible for any belongings or valuables.               Contacts, dentures or bridgework may not be worn into surgery.  Leave suitcase in the car. After surgery it may be brought to your room.  For patients admitted to the hospital, discharge time is determined by your treatment team.               Patients discharged the day of surgery will not be allowed to drive home.  Name and phone number of your driver:   Special Instructions:  Special Instructions:Special Instructions: [facility] - Preparing for Surgery  Before surgery, you can play an important role.  Because skin is not sterile, your skin needs to be as free of germs as possible.  You can reduce the number of germs on you skin by washing with CHG (chlorahexidine gluconate) soap before surgery.  CHG is an antiseptic cleaner which kills germs and bonds with the skin to continue killing germs even after washing.  Please DO NOT use if you have an allergy to CHG or antibacterial soaps.  If your skin becomes reddened/irritated stop using the CHG and inform your nurse when you arrive at Short Stay.  Do not shave  (including legs and underarms) for at least 48 hours prior to the first CHG shower.  You may shave your face.  Please follow these instructions carefully:   1.  Shower with CHG Soap the night before surgery and the morning of Surgery.  2.  If you choose to wash your hair, wash your hair first as usual with your normal shampoo.  3.  After you shampoo, rinse your hair and body thoroughly to remove the Shampoo.  4.  Use CHG as you would any other liquid soap.  You can apply chg directly  to the skin and wash gently with scrungie or a clean washcloth.  5.  Apply the CHG Soap to your body ONLY FROM THE NECK DOWN.  Do not use on open wounds or open sores.  Avoid contact with your eyes, ears, mouth and genitals (private parts).  Wash genitals (private parts) with your normal soap.  6.  Wash thoroughly, paying special attention to the area where your surgery will be performed.  7.  Thoroughly rinse your body with warm water from the neck down.  8.  DO NOT shower/wash with your normal soap after using and rinsing off the CHG Soap.  9.  Pat yourself dry with a clean towel.            10.  Wear clean pajamas.  11.  Place clean sheets on your bed the night of your first shower and do not sleep with pets.  Day of Surgery  Do not apply any lotions/deodorants the morning of surgery.  Please wear clean clothes to the hospital/surgery center.   Please read over the following fact sheets that you were given: Pain Booklet, MRSA Information and Surgical Site Infection Prevention

## 2014-10-07 ENCOUNTER — Encounter (HOSPITAL_COMMUNITY): Payer: Self-pay | Admitting: Pharmacy Technician

## 2014-10-07 ENCOUNTER — Other Ambulatory Visit: Payer: Self-pay | Admitting: Ophthalmology

## 2014-10-07 MED ORDER — KETOROLAC TROMETHAMINE 0.5 % OP SOLN
1.0000 [drp] | OPHTHALMIC | Status: AC
  Administered 2014-10-08: 1 [drp] via OPHTHALMIC
  Filled 2014-10-07 (×2): qty 5

## 2014-10-07 MED ORDER — CYCLOPENTOLATE HCL 1 % OP SOLN
1.0000 [drp] | OPHTHALMIC | Status: AC
  Administered 2014-10-08: 1 [drp] via OPHTHALMIC
  Filled 2014-10-07: qty 2

## 2014-10-07 MED ORDER — TETRACAINE HCL 0.5 % OP SOLN: 1.0000 [drp] | OPHTHALMIC | Status: AC

## 2014-10-07 MED ORDER — GATIFLOXACIN 0.5 % OP SOLN
1.0000 [drp] | OPHTHALMIC | Status: AC | PRN
  Administered 2014-10-08 (×3): 1 [drp] via OPHTHALMIC
  Filled 2014-10-07: qty 2.5

## 2014-10-07 MED ORDER — PHENYLEPHRINE HCL 2.5 % OP SOLN
1.0000 [drp] | OPHTHALMIC | Status: AC
  Administered 2014-10-08: 1 [drp] via OPHTHALMIC
  Filled 2014-10-07: qty 2

## 2014-10-07 MED ORDER — TROPICAMIDE 1 % OP SOLN
1.0000 [drp] | OPHTHALMIC | Status: AC
  Administered 2014-10-08: 1 [drp] via OPHTHALMIC
  Filled 2014-10-07: qty 3

## 2014-10-07 NOTE — Progress Notes (Signed)
Anesthesia Chart Review:  Patient is a 68 year old male scheduled for right cataract extraction on 10/08/14 by Dr. Harlon FlorWhitaker. Case is posted for MAC anesthesia.  History includes ESRD s/p renal transplant 02/2013 [facility]) with LUE AVF '13, CAD, CHF, DM2, anemia (on B12 injections), OSA with CPAP use (Dr. Marcelyn BruinsKeith Clance), GERD, HLD, HTN, former smoker, irregular heartbeat (not otherwise specified), thyroid disease (not otherwise specified). Nephrologist is Dr. Lowell GuitarPowell.  Nuclear stress test on 04/03/12 [facility]) showed: No evidence of fixed or reversible perfusion defects. Normal LV wall motion with estimated LVEF 67%.   Echo on 03/01/12 [facility]) showed; normal LV systolic function, concentric LVH, impaired LV relaxation. LVEF 54%. Normal LV systolic function. No segmental wall motion abnormality seen in the left ventricle. Physiologic TR, MR and TR. Trivial pericardial effusion, Cardiac cath on 03/19/02 showed: 20-30% mid and distal LAD, 10-20% distal RCA, mild disease OM1.  Stress echo on 03/01/12 showed: The patient had no chest pain during stress. The patient she 50% of maximum predicted heart rate. Normal left ventricular function. The estimated LVEF is 55-60%. There were no segmental wall motion abnormalities at rest. There was normal augmentation of all left ventricular wall segments with dobutamine. Nondiagnostic dobutamine echo cardiographic due to sub-target heart rate.   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  60-79% proximal L ICA stenosis. Bilateral vertebral artery flow was antegrade.  No change from 03/01/12.  Preoperative labs noted.    He thought he had an EKG and CXR within the past year, but [facility] > [facility] so he will need these on the day of surgery.  If these results are stable and otherwise not acute changes then I would anticipate that he could proceed as planned.  Velna Ochsllison Mikenzie Mccannon, PA-C [facility]/Anesthesiology Phone [phone]  [phone] 10/07/2014 12:03 PM

## 2014-10-07 NOTE — H&P (Signed)
                  History & Physical:   DATE:   09-30-2014  NAME:  Cameron [name], Cameron [name]     [id number]       HISTORY OF PRESENT ILLNESS: Chief Eye Complaints diabetic eye exam . BS 170mg% yesterday. Patient states Va is foggy and blurry at times. Patient I can no longer used my bifocals, have to read without them. No pain per pt.      LOCATION:   RIGHT EYE        QUALITY/COURSE:   Reports condition is unchanged.        INTENSITY/SEVERITY:    Reports measurement ( or degree) as   DURATION:   Reports the general length of symptoms to be   ONSET/TIMING:   Reports occurrence as 2 days ago.      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    LUNGS/Respiratory:  HEART/Cardiovascular: Reports symptoms of ABD/Gastrointestinal:   Musculoskeletal (BJE): NEURO/Neurological: PSYCH/Psychiatric:    Is the pt oriented to time, place, person? yes Mood depressed __ normal  agitated _  TOBACCO: Never smoker   ICD10: Z87.898 ICD9: V13.89     SOCIAL HISTORY: Pastor  FAMILY HISTORY: Diabetes/HTN PARENTS: Father Diabetes SIBLINGS: Brother  Diabetes/HTN Family History - 1st Degree Relatives:  Mother dead.  ALLERGIES: Vitamin D (calciferol)       PHYSICAL EXAMINATION: VS: BMI: 27.3.  BP: 160/58.  [name]: 70.00 in.  P: 62 /min.  RR: 20 /min.  W: 190lbs 0oz.   Exam: GENERAL: Appearance: General appearance can be described as well-nourished, well-developed, and in no acute distress.         Va     OD:cc 20/80 PH  OS:cc 20/50 PH  OU cc J3 j4 OU [place] J2 Glare OU: 20/70  EYEGLASSES:Rx Eye Image OD:-1.75 sph                                   OS:-1.50+0.75x172      ADD:+2.00  MR 07/31/2014 10:15  OD: -3.50 +0.25 x136 20/40 OS: -2.00 +0.75 x172 20/40 ADD:  +2.75  K's OD: 44.00 45.25 OS: 44.25 46.75  VF:   OD  full                                                OS full  Motility full  PUPILS: 3mm -MG  EYELIDS & OCULAR ADNEXA orbital fat pads each eye  SLE: Conjunctiva   pinguecula  OU  Cornea arcus   anterior chamber  debre in tear film   Iris Vanliew w no vessels   Lens  +3  nuclear  sclerosis  OD, +2-3  nuclear  sclerosis  OS   Vitreous  CCT  Ta   in mmHg    OD 19      OS 19 Time:09/30/2014 15:11   Gonio   Dilation: tropicamide 1% phenylephrine 2.5%  Fundus:  optic nerve  OD    pink color 50 -55% cup with vertical elongation                                                                 OS pink color 40% cup   Macula      OD   clear                                                 OS flat few microaneurysms noted  Vessels microaneurysms mild dot  hemorrhage each eye  Periphery normal  Visual Fields   OD: Arcate defect OS: Pos Para    Exam: GENERAL: Appearance: HEAD, EARS, NOSE AND THROAT: Ears-Nose (external) Inspection: Externally, nose and ears are normal in appearance and without scars, lesions, or nodules.      Hearing assessment shows no problems with normal conversation.      LUNGS and RESPIRATORY: Lung auscultation elicits no wheezing, rhonci, rales or rubs and with equal breath  sounds.    Respiratory effort described as breathing is unlabored and chest movement is symmetrical.    HEART (Cardiovascular): Heart auscultation discovers regular rate and rhythm; no murmur, gallop or rub. Normal heart sounds.    ABDOMEN (Gastrointestinal): Mass/Tenderness Exam: Neither are present.     MUSCULOSKELETAL (BJE): Inspection-Palpation: No major bone, joint, tendon, or muscle changes.      NEUROLOGICAL: Alert and oriented. No major deficits of coordination or sensation.      PSYCHIATRIC: Insight and judgment appear  both to be intact and appropriate.    Mood and affect are described as normal mood and full affect.    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   Actions:     Handouts: Calcium Channel Blockers.    ___________________________  Marylynn Pearson, Lauris Chroman - Inactive Problems:

## 2014-10-08 ENCOUNTER — Encounter (HOSPITAL_COMMUNITY): Payer: Self-pay | Admitting: Anesthesiology

## 2014-10-08 ENCOUNTER — Ambulatory Visit (HOSPITAL_COMMUNITY)
Admission: RE | Admit: 2014-10-08 | Discharge: 2014-10-08 | Disposition: A | Discharge status: Home / Self Care | Payer: Medicare Other | Source: Ambulatory Visit | Attending: Ophthalmology | Admitting: Ophthalmology

## 2014-10-08 ENCOUNTER — Encounter (HOSPITAL_COMMUNITY): Payer: Medicare Other | Admitting: Vascular Surgery

## 2014-10-08 ENCOUNTER — Ambulatory Visit (HOSPITAL_COMMUNITY): Payer: Medicare Other | Admitting: Anesthesiology

## 2014-10-08 ENCOUNTER — Encounter (HOSPITAL_COMMUNITY)
Admission: RE | Disposition: A | Discharge status: Home / Self Care | Payer: Self-pay | Source: Ambulatory Visit | Attending: Ophthalmology

## 2014-10-08 DIAGNOSIS — H2511 Age-related nuclear cataract, right eye: Secondary | ICD-10-CM | POA: Insufficient documentation

## 2014-10-08 DIAGNOSIS — I10 Essential (primary) hypertension: Secondary | ICD-10-CM | POA: Diagnosis not present

## 2014-10-08 DIAGNOSIS — Z87891 Personal history of nicotine dependence: Secondary | ICD-10-CM | POA: Diagnosis not present

## 2014-10-08 DIAGNOSIS — E11329 Type 2 diabetes mellitus with mild nonproliferative diabetic retinopathy without macular edema: Secondary | ICD-10-CM | POA: Diagnosis not present

## 2014-10-08 DIAGNOSIS — D649 Anemia, unspecified: Secondary | ICD-10-CM | POA: Insufficient documentation

## 2014-10-08 DIAGNOSIS — Z940 Kidney transplant status: Secondary | ICD-10-CM | POA: Insufficient documentation

## 2014-10-08 HISTORY — PX: CATARACT EXTRACTION W/PHACO: SHX586

## 2014-10-08 LAB — GLUCOSE, CAPILLARY
GLUCOSE-CAPILLARY: 211 mg/dL — AB (ref 70–99)
Glucose-Capillary: 200 mg/dL — ABNORMAL HIGH (ref 70–99)

## 2014-10-08 SURGERY — PHACOEMULSIFICATION, CATARACT, WITH IOL INSERTION
Anesthesia: Monitor Anesthesia Care | Site: Eye | Laterality: Right

## 2014-10-08 MED ORDER — PHENYLEPHRINE HCL 2.5 % OP SOLN
1.0000 [drp] | OPHTHALMIC | Status: AC
  Administered 2014-10-08 (×2): 1 [drp] via OPHTHALMIC

## 2014-10-08 MED ORDER — 0.9 % SODIUM CHLORIDE (POUR BTL) OPTIME
TOPICAL | Status: DC | PRN
  Administered 2014-10-08: 1000 mL

## 2014-10-08 MED ORDER — EPINEPHRINE HCL 1 MG/ML IJ SOLN
INTRAOCULAR | Status: DC | PRN
  Administered 2014-10-08: 09:00:00

## 2014-10-08 MED ORDER — PROPOFOL 10 MG/ML IV BOLUS
INTRAVENOUS | Status: DC | PRN
  Administered 2014-10-08: 40 mg via INTRAVENOUS

## 2014-10-08 MED ORDER — TOBRAMYCIN-DEXAMETHASONE 0.3-0.1 % OP OINT
TOPICAL_OINTMENT | OPHTHALMIC | Status: AC
  Filled 2014-10-08: qty 3.5

## 2014-10-08 MED ORDER — BSS IO SOLN
INTRAOCULAR | Status: AC
  Filled 2014-10-08: qty 500

## 2014-10-08 MED ORDER — PROPOFOL 10 MG/ML IV BOLUS
INTRAVENOUS | Status: AC
  Filled 2014-10-08: qty 20

## 2014-10-08 MED ORDER — EPINEPHRINE HCL 1 MG/ML IJ SOLN
INTRAMUSCULAR | Status: AC
  Filled 2014-10-08: qty 1

## 2014-10-08 MED ORDER — KETOROLAC TROMETHAMINE 0.5 % OP SOLN
1.0000 [drp] | OPHTHALMIC | Status: AC
  Administered 2014-10-08 (×2): 1 [drp] via OPHTHALMIC

## 2014-10-08 MED ORDER — PILOCARPINE HCL 4 % OP SOLN
OPHTHALMIC | Status: AC
  Filled 2014-10-08: qty 15

## 2014-10-08 MED ORDER — BSS IO SOLN
INTRAOCULAR | Status: AC
  Filled 2014-10-08: qty 15

## 2014-10-08 MED ORDER — ACETYLCHOLINE CHLORIDE 1:100 IO SOLR
INTRAOCULAR | Status: DC | PRN
  Administered 2014-10-08: 20 mg via INTRAOCULAR

## 2014-10-08 MED ORDER — BUPIVACAINE HCL (PF) 0.75 % IJ SOLN
INTRAMUSCULAR | Status: AC
  Filled 2014-10-08: qty 10

## 2014-10-08 MED ORDER — NA CHONDROIT SULF-NA HYALURON 40-30 MG/ML IO SOLN
INTRAOCULAR | Status: DC | PRN
  Administered 2014-10-08: 0.5 mL via INTRAOCULAR

## 2014-10-08 MED ORDER — SODIUM HYALURONATE 10 MG/ML IO SOLN
INTRAOCULAR | Status: AC
  Filled 2014-10-08: qty 0.85

## 2014-10-08 MED ORDER — ACETYLCHOLINE CHLORIDE 1:100 IO SOLR
INTRAOCULAR | Status: AC
  Filled 2014-10-08: qty 1

## 2014-10-08 MED ORDER — ONDANSETRON HCL 4 MG/2ML IJ SOLN
INTRAMUSCULAR | Status: AC
  Filled 2014-10-08: qty 2

## 2014-10-08 MED ORDER — HYALURONIDASE HUMAN 150 UNIT/ML IJ SOLN
INTRAMUSCULAR | Status: AC
  Filled 2014-10-08: qty 1

## 2014-10-08 MED ORDER — MIDAZOLAM HCL 2 MG/2ML IJ SOLN
INTRAMUSCULAR | Status: AC
  Filled 2014-10-08: qty 2

## 2014-10-08 MED ORDER — TETRACAINE HCL 0.5 % OP SOLN
OPHTHALMIC | Status: AC
  Filled 2014-10-08: qty 2

## 2014-10-08 MED ORDER — DEXAMETHASONE SODIUM PHOSPHATE 10 MG/ML IJ SOLN
INTRAMUSCULAR | Status: AC
  Filled 2014-10-08: qty 1

## 2014-10-08 MED ORDER — SODIUM HYALURONATE 10 MG/ML IO SOLN
INTRAOCULAR | Status: DC | PRN
  Administered 2014-10-08: 0.85 mL via INTRAOCULAR

## 2014-10-08 MED ORDER — HYALURONIDASE HUMAN 150 UNIT/ML IJ SOLN
INTRAMUSCULAR | Status: DC | PRN
  Administered 2014-10-08: 09:00:00 via RETROBULBAR

## 2014-10-08 MED ORDER — TROPICAMIDE 1 % OP SOLN
1.0000 [drp] | OPHTHALMIC | Status: AC
  Administered 2014-10-08 (×2): 1 [drp] via OPHTHALMIC

## 2014-10-08 MED ORDER — FENTANYL CITRATE 0.05 MG/ML IJ SOLN
INTRAMUSCULAR | Status: DC | PRN
  Administered 2014-10-08 (×2): 25 ug via INTRAVENOUS

## 2014-10-08 MED ORDER — GENTAMICIN SULFATE 40 MG/ML IJ SOLN
INTRAMUSCULAR | Status: AC
  Filled 2014-10-08: qty 2

## 2014-10-08 MED ORDER — LIDOCAINE-EPINEPHRINE 2 %-1:100000 IJ SOLN
INTRAMUSCULAR | Status: AC
  Filled 2014-10-08: qty 1

## 2014-10-08 MED ORDER — ONDANSETRON HCL 4 MG/2ML IJ SOLN
INTRAMUSCULAR | Status: DC | PRN
  Administered 2014-10-08: 4 mg via INTRAVENOUS

## 2014-10-08 MED ORDER — FENTANYL CITRATE 0.05 MG/ML IJ SOLN
INTRAMUSCULAR | Status: AC
  Filled 2014-10-08: qty 5

## 2014-10-08 MED ORDER — CYCLOPENTOLATE HCL 1 % OP SOLN
1.0000 [drp] | OPHTHALMIC | Status: AC
  Administered 2014-10-08 (×2): 1 [drp] via OPHTHALMIC

## 2014-10-08 MED ORDER — LACTATED RINGERS IV SOLN
INTRAVENOUS | Status: DC | PRN
  Administered 2014-10-08: 08:00:00 via INTRAVENOUS

## 2014-10-08 MED ORDER — PROPOFOL INFUSION 10 MG/ML OPTIME
INTRAVENOUS | Status: DC | PRN
Start: 2014-10-08 — End: 2014-10-08
  Administered 2014-10-08: 25 ug/kg/min via INTRAVENOUS

## 2014-10-08 MED ORDER — LIDOCAINE HCL 2 % IJ SOLN
INTRAMUSCULAR | Status: AC
  Filled 2014-10-08: qty 20

## 2014-10-08 MED ORDER — TOBRAMYCIN 0.3 % OP OINT
TOPICAL_OINTMENT | OPHTHALMIC | Status: DC | PRN
  Administered 2014-10-08: 1 via OPHTHALMIC

## 2014-10-08 MED ORDER — NA CHONDROIT SULF-NA HYALURON 40-30 MG/ML IO SOLN
INTRAOCULAR | Status: AC
  Filled 2014-10-08: qty 0.5

## 2014-10-08 SURGICAL SUPPLY — 33 items
APL SRG 3 HI ABS STRL LF PLS (MISCELLANEOUS) ×1
APPLICATOR COTTON TIP 6IN STRL (MISCELLANEOUS) ×3 IMPLANT
APPLICATOR DR MATTHEWS STRL (MISCELLANEOUS) ×3 IMPLANT
BLADE KERATOME 2.75 (BLADE) ×2 IMPLANT
BLADE KERATOME 2.75MM (BLADE) ×1
CANNULA ANTERIOR CHAMBER 27GA (MISCELLANEOUS) ×3 IMPLANT
CORDS BIPOLAR (ELECTRODE) IMPLANT
COVER MAYO STAND STRL (DRAPES) ×3 IMPLANT
DRAPE OPHTHALMIC 40X48 W POUCH (DRAPES) ×3 IMPLANT
DRAPE RETRACTOR (MISCELLANEOUS) ×3 IMPLANT
GLOVE BIO SURGEON STRL SZ8 (GLOVE) ×3 IMPLANT
GLOVE SURG SS PI 7.0 STRL IVOR (GLOVE) ×6 IMPLANT
GOWN STRL REUS W/ TWL LRG LVL3 (GOWN DISPOSABLE) ×2 IMPLANT
GOWN STRL REUS W/TWL LRG LVL3 (GOWN DISPOSABLE) ×6
KIT BASIN OR (CUSTOM PROCEDURE TRAY) ×3 IMPLANT
KIT ROOM TURNOVER OR (KITS) ×3 IMPLANT
LENS IOL ACRSF IQ PC 20.0 (Intraocular Lens) ×1 IMPLANT
LENS IOL ACRYSOF IQ POST 20.0 (Intraocular Lens) ×3 IMPLANT
NEEDLE 18GX1X1/2 (RX/OR ONLY) (NEEDLE) ×3 IMPLANT
NEEDLE 25GX 5/8IN NON SAFETY (NEEDLE) ×3 IMPLANT
NEEDLE FILTER BLUNT 18X 1/2SAF (NEEDLE) ×2
NEEDLE FILTER BLUNT 18X1 1/2 (NEEDLE) ×1 IMPLANT
NS IRRIG 1000ML POUR BTL (IV SOLUTION) ×3 IMPLANT
PACK CATARACT CUSTOM (CUSTOM PROCEDURE TRAY) ×3 IMPLANT
PAD ARMBOARD 7.5X6 YLW CONV (MISCELLANEOUS) ×3 IMPLANT
PAK PIK CVS CATARACT (OPHTHALMIC) ×3 IMPLANT
SUT ETHILON 10 0 CS140 6 (SUTURE) ×3 IMPLANT
SUT SILK 6 0 G 6 (SUTURE) IMPLANT
SYR TB 1ML LUER SLIP (SYRINGE) ×6 IMPLANT
TIP ABS 45DEG FLARED 0.9MM (TIP) ×3 IMPLANT
TOWEL OR 17X26 10 PK STRL BLUE (TOWEL DISPOSABLE) ×3 IMPLANT
WATER STERILE IRR 1000ML POUR (IV SOLUTION) ×3 IMPLANT
WIPE INSTRUMENT VISIWIPE 73X73 (MISCELLANEOUS) ×3 IMPLANT

## 2014-10-08 NOTE — Transfer of Care (Signed)
Immediate Anesthesia Transfer of Care Note  Patient: Cameron [name]  Procedure(s) Performed: Procedure(s): CATARACT EXTRACTION PHACO AND INTRAOCULAR LENS PLACEMENT (IOC) (Right)  Patient Location: PACU  Anesthesia Type:MAC  Level of Consciousness: awake, alert , oriented and patient cooperative  Airway & Oxygen Therapy: Patient Spontanous Breathing and Patient connected to nasal cannula oxygen  Post-op Assessment: Report given to PACU RN, Post -op Vital signs reviewed and stable and Patient moving all extremities  Post vital signs: Reviewed and stable  Complications: No apparent anesthesia complications

## 2014-10-08 NOTE — H&P (View-Only) (Signed)
History & Physical:   DATE:   09-30-2014  NAME:  Beverly SessionsBrown, Jeancarlos H     [id number]       HISTORY OF PRESENT ILLNESS: Chief Eye Complaints diabetic eye exam . BS 170mg % yesterday. Patient states Va is foggy and blurry at times. Patient I can no longer used my bifocals, have to read without them. No pain per pt.      LOCATION:   RIGHT EYE        QUALITY/COURSE:   Reports condition is unchanged.        INTENSITY/SEVERITY:    Reports measurement ( or degree) as   DURATION:   Reports the general length of symptoms to be   ONSET/TIMING:   Reports occurrence as 2 days ago.      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    LUNGS/Respiratory:  HEART/Cardiovascular: Reports symptoms of ABD/Gastrointestinal:   Musculoskeletal (BJE): NEURO/Neurological: PSYCH/Psychiatric:    Is the pt oriented to time, place, person? yes Mood depressed __ normal  agitated _  TOBACCO: Never smoker   ICD10: Z87.898 ICD9: V13.89     SOCIAL HISTORY: Pastor  FAMILY HISTORY: Diabetes/HTN PARENTS: Father Diabetes SIBLINGS: Brother  Diabetes/HTN Family History - 1st Degree Relatives:  Mother dead.  ALLERGIES: Vitamin D (calciferol)       PHYSICAL EXAMINATION: VS: BMI: 27.3.  BP: 160/58.  H: 70.00 in.  P: 62 /min.  RR: 20 /min.  W: 190lbs 0oz.   Exam: GENERAL: Appearance: General appearance can be described as well-nourished, well-developed, and in no acute distress.         Va     OD:cc 20/80 PH  OS:cc 20/50 PH  OU cc J3 j4 OU [place] J2 Glare OU: 20/70  EYEGLASSES:Rx Eye Image OD:-1.75 sph                                   OS:-1.50+0.75x172      ADD:+2.00  MR 07/31/2014 10:15  OD: -3.50 +0.25 x136 20/40 OS: -2.00 +0.75 x172 20/40 ADD:  +2.75  K's OD: 44.00 45.25 OS: 44.25 46.75  VF:   OD  full  OS full  Motility full  PUPILS: 3mm -MG  EYELIDS & OCULAR ADNEXA orbital fat pads each eye  SLE: Conjunctiva   pinguecula  OU  Cornea arcus   anterior chamber  debre in tear film   Iris Vanliew w no vessels   Lens  +3  nuclear  sclerosis  OD, +2-3  nuclear  sclerosis  OS   Vitreous  CCT  Ta   in mmHg    OD 19      OS 19 Time:09/30/2014 15:11   Gonio   Dilation: tropicamide 1% phenylephrine 2.5%  Fundus:  optic nerve  OD    pink color 50 -55% cup with vertical elongation                                                                 OS pink color 40% cup   Macula      OD   clear                                                 OS flat few microaneurysms noted  Vessels microaneurysms mild dot  hemorrhage each eye  Periphery normal  Visual Fields   OD: Arcate defect OS: Pos Para    Exam: GENERAL: Appearance: HEAD, EARS, NOSE AND THROAT: Ears-Nose (external) Inspection: Externally, nose and ears are normal in appearance and without scars, lesions, or nodules.      Hearing assessment shows no problems with normal conversation.      LUNGS and RESPIRATORY: Lung auscultation elicits no wheezing, rhonci, rales or rubs and with equal breath  sounds.    Respiratory effort described as breathing is unlabored and chest movement is symmetrical.    HEART (Cardiovascular): Heart auscultation discovers regular rate and rhythm; no murmur, gallop or rub. Normal heart sounds.    ABDOMEN (Gastrointestinal): Mass/Tenderness Exam: Neither are present.     MUSCULOSKELETAL (BJE): Inspection-Palpation: No major bone, joint, tendon, or muscle changes.      NEUROLOGICAL: Alert and oriented. No major deficits of coordination or sensation.      PSYCHIATRIC: Insight and judgment appear  both to be intact and appropriate.    Mood and affect are described as normal mood and full affect.    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   Actions:     Handouts: Calcium Channel Blockers.    ___________________________  Marylynn Pearson, Lauris Chroman - Inactive Problems:

## 2014-10-08 NOTE — Addendum Note (Signed)
Addendum created 10/08/14 1138 by Marni GriffonKaren B Hurschel Paynter, CRNA   Modules edited: Anesthesia Medication Administration

## 2014-10-08 NOTE — Progress Notes (Signed)
Dr. Glade Stanford. Fitzgerald asked about obtaining EKG/CXR on day of surgery but reported that he had EKG on chart that was faxed and did not need a CXR.

## 2014-10-08 NOTE — Interval H&P Note (Signed)
History and Physical Interval Note:  10/08/2014 8:18 AM  Cameron [name]  has presented today for surgery, with the diagnosis of Age related nuclear cataract right eye  The various methods of treatment have been discussed with the patient and family. After consideration of risks, benefits and other options for treatment, the patient has consented to  Procedure(s): CATARACT EXTRACTION PHACO AND INTRAOCULAR LENS PLACEMENT (IOC) (Right) as a surgical intervention .  The patient's history has been reviewed, patient examined, no change in status, stable for surgery.  I have reviewed the patient's chart and labs.  Questions were answered to the patient's satisfaction.     Maribelle Hopple

## 2014-10-08 NOTE — Interval H&P Note (Signed)
History and Physical Interval Note:  10/08/2014 8:21 AM  Cameron [name]  has presented today for surgery, with the diagnosis of Age related nuclear cataract right eye  The various methods of treatment have been discussed with the patient and family. After consideration of risks, benefits and other options for treatment, the patient has consented to  Procedure(s): CATARACT EXTRACTION PHACO AND INTRAOCULAR LENS PLACEMENT (IOC) (Right) as a surgical intervention .  The patient's history has been reviewed, patient examined, no change in status, stable for surgery.  I have reviewed the patient's chart and labs.  Questions were answered to the patient's satisfaction.     Kambri Dismore

## 2014-10-08 NOTE — Anesthesia Postprocedure Evaluation (Signed)
  Anesthesia Post-op Note  Patient: Cameron [name]  Procedure(s) Performed: Procedure(s): CATARACT EXTRACTION PHACO AND INTRAOCULAR LENS PLACEMENT (IOC) (Right)  Patient Location: PACU  Anesthesia Type:MAC  Level of Consciousness: awake, alert  and oriented  Airway and Oxygen Therapy: Patient Spontanous Breathing  Post-op Pain: none  Post-op Assessment: Post-op Vital signs reviewed  Post-op Vital Signs: Reviewed  Last Vitals:  Filed Vitals:   10/08/14 1015  BP:   Pulse: 57  Temp:   Resp: 17    Complications: No apparent anesthesia complications

## 2014-10-08 NOTE — Discharge Instructions (Signed)
The patient may remove the eye patch today at 1:00 PM and and spill eye drops given to him at the doctor's office. The patient should sleep on his back or left side did not apply any pressure to the eye while sleep the patient should use the plastic eye she'll over the eye he should keep the eye covered at all times with either sunglasses or eyeglasses. Avoid heavy lifting bending and straining.

## 2014-10-08 NOTE — Anesthesia Preprocedure Evaluation (Addendum)
Anesthesia Evaluation  Patient identified by MRN, date of birth, ID band Patient awake    Reviewed: Allergy & Precautions, H&P , NPO status , Patient's Chart, lab work & pertinent test results, reviewed documented beta blocker date and time   Airway Mallampati: III TM Distance: >3 FB Neck ROM: Full    Dental  (+) Teeth Intact, Dental Advisory Given, Partial Upper, Partial Lower   Pulmonary sleep apnea , former smoker,  breath sounds clear to auscultation        Cardiovascular hypertension, Pt. on home beta blockers and Pt. on medications + CAD Rhythm:Regular Rate:Normal     Neuro/Psych negative neurological ROS  negative psych ROS   GI/Hepatic Neg liver ROS, GERD-  Controlled and Medicated,  Endo/Other  diabetes, Poorly Controlled, Type 2, Insulin Dependent  Renal/GU CRFRenal disease (s/p renal transplant in 2013)Kidney transplant 02/2013; Creatinine normal this visit.     Musculoskeletal negative musculoskeletal ROS (+)   Abdominal   Peds  Hematology  (+) anemia ,   Anesthesia Other Findings   Reproductive/Obstetrics                         Anesthesia Physical Anesthesia Plan  ASA: III  Anesthesia Plan: MAC   Post-op Pain Management:    Induction: Intravenous  Airway Management Planned: Simple Face Mask  Additional Equipment:   Intra-op Plan:   Post-operative Plan:   Informed Consent: I have reviewed the patients History and Physical, chart, labs and discussed the procedure including the risks, benefits and alternatives for the proposed anesthesia with the patient or authorized representative who has indicated his/her understanding and acceptance.   Dental advisory given  Plan Discussed with: CRNA and Surgeon  Anesthesia Plan Comments:         Anesthesia Quick Evaluation

## 2014-10-08 NOTE — Op Note (Signed)
Preoperative diagnosis: Visually significant cataract right eye Postoperative diagnosis: Same Procedure: Applicable sedation with intraocular lens implant Anesthesia: 2% Xylocaine with epinephrine in a 50-50 mixture of 0.75% Marcaine with ample [place] Assistant MingLee Complications: None Procedure: The patient was transported to the operating room where he was given a peribulbar block with the aforementioned local anesthetic agent. The patient's face was then prepped and draped in the usual sterile fashion. With the operating microscope and positioned in the surgeon sitting temporally a Weck-Cel sponge was used to fixate the globe and a 15 blade was used to enter through clear cornea at the 11:00 position and Viscoat was injected in the anterior chamber. Using an additional Weck-Cel sponge a 2.75 mm keratome blade was used in a stepwise fashion through temporal clear cornea to into the anterior chamber. A bent 25-gauge needle was used to incise anterior capsule and a continuous tear curvilinear capsulorrhexis was formed. BSS was then used to hydrodissect and hydrodelineate the nucleus the nucleus was rotated in the capsular bag. Emulsification unit was then used to remove the epinucleus and nucleus was moderately firm it was sculpted centrally and then using a snapper hook and difficult tip the nucleus was cracked into 4 quadrants all nuclear fragments were removed from the eye the irrigation aspiration device was then used to aspirate cortical fibers from the eye after all cortical fibers had been removed the posterior capsule remained intact. Provisc was injected in the anterior chamber to inflate the capsular bag. The intraocular lens implant was examined and noted to have no defects the implant was an Alcon AcrySof SN 60 WF IQ lens power 21 0 diopters SN #[id number] the lens is placed in the lens injector and injected in the capsular bag it was position with a Kuglen hook. Irrigation aspiration device  was then used to remove viscoelastic from the eye the eye was pressurized and Miochol was injected in the anterior chamber the pupil was noted to constrict symmetrically. A single 10-0 nylon suture was placed to achieve watertight closure there being no leakage all instruments were removed from the eye topical TobraDex ointment was applied to the eye a patch and Fox shield. were placed and the patient returned to recovery area in stable condition. Chalmers Guestoy Lexx [place] M.D.

## 2014-10-11 ENCOUNTER — Encounter (HOSPITAL_COMMUNITY): Payer: Self-pay | Admitting: Ophthalmology

## 2014-10-31 ENCOUNTER — Telehealth: Payer: Self-pay | Admitting: Pulmonary Disease

## 2014-10-31 NOTE — Telephone Encounter (Signed)
ATC line rang several times, NA WCB

## 2014-10-31 NOTE — Telephone Encounter (Signed)
Pt's wife returned call. Diane stated that she is going to pay out of pocket for a mini CPAP from another company she heard about on the radio. Diane stated she will call this third party company which will then contact us to confirm he is using CPAP. Nothing further needed at this time.

## 2014-11-03 ENCOUNTER — Telehealth: Payer: Self-pay | Admitting: Pulmonary Disease

## 2014-11-03 DIAGNOSIS — G4733 Obstructive sleep apnea (adult) (pediatric): Secondary | ICD-10-CM

## 2014-11-03 NOTE — Telephone Encounter (Signed)
lmtcb x1 

## 2014-11-04 NOTE — Telephone Encounter (Signed)
Just send order saying:  New cpap machine Set on auto 5-20cm.  ?can they enroll in [place] or [facility] anywhere?  I have no idea what kind of machine he got.

## 2014-11-04 NOTE — Telephone Encounter (Signed)
Shema, [employer] is returning call & can be reached at [phone].  Antionette FairyHolly D Pryor

## 2014-11-04 NOTE — Telephone Encounter (Signed)
[phone] SHEMA CALLING BACK

## 2014-11-04 NOTE — Telephone Encounter (Signed)
Per phone msg from 10/31/14; wife called reporting they purchased a cpap from [employer], a company they heard about on the radio.  Wife reporting this company will be contacting our office for orders.  Spoke with Shema.  Reports pt purchased a portable auto titrating cpap from them, [email or web address].  Requesting order for new machine along with pressure setting.  Last order for setting placed in 2013 for pressure of 12 cm.  [facility].  Dr. Shelle Ironlance, pls advise if this is ok.  Thank you.

## 2014-11-04 NOTE — Telephone Encounter (Signed)
[facility]

## 2014-11-04 NOTE — Telephone Encounter (Signed)
Order placed nothing further needed.  °

## 2014-11-17 ENCOUNTER — Encounter (INDEPENDENT_AMBULATORY_CARE_PROVIDER_SITE_OTHER): Payer: Medicare Other | Admitting: Ophthalmology

## 2014-11-17 DIAGNOSIS — E11311 Type 2 diabetes mellitus with unspecified diabetic retinopathy with macular edema: Secondary | ICD-10-CM | POA: Diagnosis not present

## 2014-11-17 DIAGNOSIS — E11331 Type 2 diabetes mellitus with moderate nonproliferative diabetic retinopathy with macular edema: Secondary | ICD-10-CM | POA: Diagnosis not present

## 2014-11-17 DIAGNOSIS — H4311 Vitreous hemorrhage, right eye: Secondary | ICD-10-CM | POA: Diagnosis not present

## 2014-11-17 DIAGNOSIS — H35033 Hypertensive retinopathy, bilateral: Secondary | ICD-10-CM

## 2014-11-17 DIAGNOSIS — I10 Essential (primary) hypertension: Secondary | ICD-10-CM

## 2014-11-17 DIAGNOSIS — E11351 Type 2 diabetes mellitus with proliferative diabetic retinopathy with macular edema: Secondary | ICD-10-CM

## 2014-11-26 ENCOUNTER — Other Ambulatory Visit (INDEPENDENT_AMBULATORY_CARE_PROVIDER_SITE_OTHER): Payer: Medicare Other | Admitting: Ophthalmology

## 2014-12-02 ENCOUNTER — Other Ambulatory Visit (INDEPENDENT_AMBULATORY_CARE_PROVIDER_SITE_OTHER): Payer: Medicare Other | Admitting: Ophthalmology

## 2014-12-08 ENCOUNTER — Other Ambulatory Visit: Payer: Self-pay | Admitting: Nurse Practitioner

## 2015-04-13 ENCOUNTER — Telehealth: Payer: Self-pay | Admitting: Pulmonary Disease

## 2015-04-13 DIAGNOSIS — G4733 Obstructive sleep apnea (adult) (pediatric): Secondary | ICD-10-CM

## 2015-04-13 NOTE — Telephone Encounter (Signed)
Order has been placed. Pt's wife is aware. Nothing further was needed. 

## 2015-05-14 ENCOUNTER — Encounter: Payer: Self-pay | Admitting: Pulmonary Disease

## 2015-05-14 ENCOUNTER — Ambulatory Visit (INDEPENDENT_AMBULATORY_CARE_PROVIDER_SITE_OTHER): Payer: Medicare Other | Admitting: Pulmonary Disease

## 2015-05-14 VITALS — BP 158/60 | HR 63 | Temp 97.7°F | Ht 71.0 in | Wt 208.6 lb

## 2015-05-14 DIAGNOSIS — G4733 Obstructive sleep apnea (adult) (pediatric): Secondary | ICD-10-CM

## 2015-05-14 NOTE — Patient Instructions (Signed)
Continue on cpap, and keep up with mask changes and supplies. Keep working on weight loss followup with Dr. Sood in one year.  

## 2015-05-14 NOTE — Progress Notes (Signed)
   Subjective:    Patient ID: Cameron [name], male    DOB: 12-26-1946, 69 y.o.   MRN: [id number]  HPI The patient comes in today for follow-up of his obstructive sleep apnea. He is wearing C Pap compliantly, and is having no issues with his mask or pressure. He is well with the device, and is satisfied with his daytime alertness. He has gained weight since the last visit, but is having difficulties because of chronic prednisone associated with his recent transplant.   Review of Systems  Constitutional: Negative for fever and unexpected weight change.  HENT: Negative for congestion, dental problem, ear pain, nosebleeds, postnasal drip, rhinorrhea, sinus pressure, sneezing, sore throat and trouble swallowing.   Eyes: Negative for redness and itching.  Respiratory: Negative for cough, chest tightness, shortness of breath and wheezing.   Cardiovascular: Negative for palpitations and leg swelling.  Gastrointestinal: Negative for nausea and vomiting.  Genitourinary: Negative for dysuria.  Musculoskeletal: Negative for joint swelling.  Skin: Negative for rash.  Neurological: Negative for headaches.  Hematological: Does not bruise/bleed easily.  Psychiatric/Behavioral: Negative for dysphoric mood. The patient is not nervous/anxious.        Objective:   Physical Exam Overweight male in no acute distress Nose without purulence or discharge noted No skin breakdown or pressure necrosis from the C Pap mask Neck without lymphadenopathy or thyromegaly Lower extremities without edema, no cyanosis Alert and oriented, moves all 4 extremities.       Assessment & Plan:

## 2015-05-14 NOTE — Assessment & Plan Note (Signed)
The patient continues to do well on his C Pap device for his sleep apnea. He is satisfied with his sleep and daytime alertness, and has been keeping up with his supplies. I've asked him to continue with this, and to work aggressively on weight loss.

## 2015-09-16 ENCOUNTER — Encounter (INDEPENDENT_AMBULATORY_CARE_PROVIDER_SITE_OTHER): Payer: Medicare Other | Admitting: Ophthalmology

## 2015-09-16 DIAGNOSIS — E11331 Type 2 diabetes mellitus with moderate nonproliferative diabetic retinopathy with macular edema: Secondary | ICD-10-CM

## 2015-09-16 DIAGNOSIS — E11359 Type 2 diabetes mellitus with proliferative diabetic retinopathy without macular edema: Secondary | ICD-10-CM

## 2015-09-16 DIAGNOSIS — H35033 Hypertensive retinopathy, bilateral: Secondary | ICD-10-CM | POA: Diagnosis not present

## 2015-09-16 DIAGNOSIS — I10 Essential (primary) hypertension: Secondary | ICD-10-CM | POA: Diagnosis not present

## 2015-09-16 DIAGNOSIS — E11311 Type 2 diabetes mellitus with unspecified diabetic retinopathy with macular edema: Secondary | ICD-10-CM | POA: Diagnosis not present

## 2015-09-16 DIAGNOSIS — H43813 Vitreous degeneration, bilateral: Secondary | ICD-10-CM

## 2015-09-16 DIAGNOSIS — H4311 Vitreous hemorrhage, right eye: Secondary | ICD-10-CM

## 2016-01-18 ENCOUNTER — Ambulatory Visit (INDEPENDENT_AMBULATORY_CARE_PROVIDER_SITE_OTHER): Payer: Medicare Other | Admitting: Ophthalmology

## 2016-02-15 ENCOUNTER — Ambulatory Visit (INDEPENDENT_AMBULATORY_CARE_PROVIDER_SITE_OTHER): Payer: Medicare Other | Admitting: Ophthalmology

## 2016-06-06 ENCOUNTER — Ambulatory Visit: Payer: Medicare Other | Admitting: Pulmonary Disease

## 2016-12-15 ENCOUNTER — Other Ambulatory Visit: Payer: Self-pay | Admitting: Orthopaedic Surgery

## 2016-12-21 NOTE — Pre-Procedure Instructions (Signed)
Cameron [name]  12/21/2016      [id number] [place]- [place], [place] - [address] [place] [address] Phone: [phone] Fax: [phone]  [id number] - [place], [place] - [address] [facility] [address] [place] [address] Phone: [phone] Fax: [phone]    Your procedure is scheduled on  01/03/17 Tuesday   [facility] at 530 [facility].M.  Call this number if you have problems the morning of surgery:  [phone]   Remember:  Do not eat food or drink liquids after midnight.  Take these medicines the morning of surgery with A SIP OF WATER   CARVEDILOL (COREG) ,CLONIDINE, DOXAZOSIN (CARDURA) ,BIDIL , NIDEDIPINE, PANTOPRAZOLE, PROGRAF      (STOP 7 DAYS PRIOR TO SURGERY ASPIRIN OR ASPIRIN PRODUCTS, IBUPROFEN/ ADVIL/ MOTRIN, GOODY POWDERS, BC'S, HERBAL MEDICINES, VITAMIN E, TURMERIC, CINNAMON)    How to Manage Your Diabetes Before and After Surgery  Why is it important to control my blood sugar before and after surgery? . Improving blood sugar levels before and after surgery helps healing and can limit problems. . A way of improving blood sugar control is eating a healthy diet by: o  Eating less sugar and carbohydrates o  Increasing activity/exercise o  Talking with your doctor about reaching your blood sugar goals . High blood sugars (greater than 180 mg/dL) can raise your risk of infections and slow your recovery, so you will need to focus on controlling your diabetes during the weeks before surgery. . Make sure that the doctor who takes care of your diabetes knows about your planned surgery including the date and location.  How do I manage my blood sugar before surgery? . Check your blood sugar at least 4 times a day, starting 2 days before surgery, to make sure that the level is not too high or low. o Check your blood sugar the morning of your surgery  when you wake up and every 2 hours until you get to the Short Stay unit. . If your blood sugar is less than 70 mg/dL, you will need to treat for low blood sugar: o Do not take insulin. o Treat a low blood sugar (less than 70 mg/dL) with  cup of clear juice (cranberry or apple), 4 glucose tablets, OR glucose gel. o Recheck blood sugar in 15 minutes after treatment (to make sure it is greater than 70 mg/dL). If your blood sugar is not greater than 70 mg/dL on recheck, call [phone] for further instructions. . Report your blood sugar to the short stay nurse when you get to Short Stay.  . If you are admitted to the hospital after surgery: o Your blood sugar will be checked by the staff and you will probably be given insulin after surgery (instead of oral diabetes medicines) to make sure you have good blood sugar levels. o The goal for blood sugar control after surgery is 80-180 mg/dL.              WHAT DO I DO ABOUT MY DIABETES MEDICATION?   [name]. Do not take oral diabetes medicines (pills) the morning of surgery.  . THE NIGHT BEFORE SURGERY, take __10_________ units of __LANTUS_________insulin.       [name]. HE MORNING OF SURGERY, take ______10_______ units of ___LANTUS_______insulin.  . The day of surgery, do not take other diabetes injectables, including Byetta (exenatide), Bydureon (exenatide ER), Victoza (liraglutide), or  Trulicity (dulaglutide).  . If your CBG is greater than 220 mg/dL, you may take  of your sliding scale (correction) dose of insulin.  Other Instructions:          Patient Signature:  Date:   Nurse Signature:  Date:   Reviewed and Endorsed by [facility] Patient Education Committee, August 2015  Do not wear jewelry, make-up or nail polish.  Do not wear lotions, powders, or perfumes, or deoderant.  Do not shave 48 hours prior to surgery.  Men may shave face and neck.  Do not bring valuables to the hospital.  [facility] is not responsible for any  belongings or valuables.  Contacts, dentures or bridgework may not be worn into surgery.  Leave your suitcase in the car.  After surgery it may be brought to your room.  For patients admitted to the hospital, discharge time will be determined by your treatment team.  Patients discharged the day of surgery will not be allowed to drive home.   Name and phone number of your driver:    Special instructions:  [facility] - Preparing for Surgery  Before surgery, you can play an important role.  Because skin is not sterile, your skin needs to be as free of germs as possible.  You can reduce the number of germs on you skin by washing with CHG (chlorahexidine gluconate) soap before surgery.  CHG is an antiseptic cleaner which kills germs and bonds with the skin to continue killing germs even after washing.  Please DO NOT use if you have an allergy to CHG or antibacterial soaps.  If your skin becomes reddened/irritated stop using the CHG and inform your nurse when you arrive at Short Stay.  Do not shave (including legs and underarms) for at least 48 hours prior to the first CHG shower.  You may shave your face.  Please follow these instructions carefully:   1.  Shower with CHG Soap the night before surgery and the                                morning of Surgery.  2.  If you choose to wash your hair, wash your hair first as usual with your       normal shampoo.  3.  After you shampoo, rinse your hair and body thoroughly to remove the                      Shampoo.  4.  Use CHG as you would any other liquid soap.  You can apply chg directly       to the skin and wash gently with scrungie or a clean washcloth.  5.  Apply the CHG Soap to your body ONLY FROM THE NECK DOWN.        Do not use on open wounds or open sores.  Avoid contact with your eyes,       ears, mouth and genitals (private parts).  Wash genitals (private parts)       with your normal soap.  6.  Wash thoroughly, paying special attention to  the area where your surgery        will be performed.  7.  Thoroughly rinse your body with warm water from the neck down.  8.  DO NOT shower/wash with your normal soap after using and rinsing off       the CHG Soap.  9.  Pat yourself dry with a clean towel.            10.  Wear clean pajamas.            11.  Place clean sheets on your bed the night of your first shower and do not        sleep with pets.  Day of Surgery  Do not apply any lotions/deoderants the morning of surgery.  Please wear clean clothes to the hospital/surgery center.    Please read over the following fact sheets that you were given. MRSA Information and Surgical Site Infection Prevention

## 2016-12-22 ENCOUNTER — Encounter (HOSPITAL_COMMUNITY)
Admission: RE | Admit: 2016-12-22 | Discharge: 2016-12-22 | Disposition: A | Discharge status: Home / Self Care | Payer: Medicare Other | Source: Ambulatory Visit | Attending: Orthopaedic Surgery | Admitting: Orthopaedic Surgery

## 2016-12-22 ENCOUNTER — Encounter (HOSPITAL_COMMUNITY): Payer: Self-pay

## 2016-12-22 DIAGNOSIS — G4733 Obstructive sleep apnea (adult) (pediatric): Secondary | ICD-10-CM | POA: Diagnosis not present

## 2016-12-22 DIAGNOSIS — I110 Hypertensive heart disease with heart failure: Secondary | ICD-10-CM | POA: Insufficient documentation

## 2016-12-22 DIAGNOSIS — M1612 Unilateral primary osteoarthritis, left hip: Secondary | ICD-10-CM | POA: Diagnosis not present

## 2016-12-22 DIAGNOSIS — Z0183 Encounter for blood typing: Secondary | ICD-10-CM | POA: Diagnosis not present

## 2016-12-22 DIAGNOSIS — Z01812 Encounter for preprocedural laboratory examination: Secondary | ICD-10-CM | POA: Diagnosis not present

## 2016-12-22 DIAGNOSIS — Z01818 Encounter for other preprocedural examination: Secondary | ICD-10-CM | POA: Diagnosis present

## 2016-12-22 DIAGNOSIS — K219 Gastro-esophageal reflux disease without esophagitis: Secondary | ICD-10-CM | POA: Diagnosis not present

## 2016-12-22 DIAGNOSIS — I2510 Atherosclerotic heart disease of native coronary artery without angina pectoris: Secondary | ICD-10-CM | POA: Diagnosis not present

## 2016-12-22 DIAGNOSIS — Z940 Kidney transplant status: Secondary | ICD-10-CM | POA: Diagnosis not present

## 2016-12-22 DIAGNOSIS — E119 Type 2 diabetes mellitus without complications: Secondary | ICD-10-CM | POA: Diagnosis not present

## 2016-12-22 DIAGNOSIS — R9431 Abnormal electrocardiogram [ECG] [EKG]: Secondary | ICD-10-CM | POA: Diagnosis not present

## 2016-12-22 DIAGNOSIS — I700 Atherosclerosis of aorta: Secondary | ICD-10-CM | POA: Diagnosis not present

## 2016-12-22 DIAGNOSIS — Z794 Long term (current) use of insulin: Secondary | ICD-10-CM | POA: Insufficient documentation

## 2016-12-22 DIAGNOSIS — Z79899 Other long term (current) drug therapy: Secondary | ICD-10-CM | POA: Insufficient documentation

## 2016-12-22 DIAGNOSIS — I509 Heart failure, unspecified: Secondary | ICD-10-CM | POA: Diagnosis not present

## 2016-12-22 HISTORY — DX: Unspecified osteoarthritis, unspecified site: M19.90

## 2016-12-22 LAB — URINALYSIS, ROUTINE W REFLEX MICROSCOPIC
BILIRUBIN URINE: NEGATIVE
Glucose, UA: >=500 mg/dL — AB
HGB URINE DIPSTICK: NEGATIVE
Ketones, ur: NEGATIVE mg/dL
Nitrite: NEGATIVE
PH: 6 (ref 5.0–8.0)
Protein, ur: 30 mg/dL — AB
SPECIFIC GRAVITY, URINE: 1.02 (ref 1.005–1.030)

## 2016-12-22 LAB — GLUCOSE, CAPILLARY: GLUCOSE-CAPILLARY: 135 mg/dL — AB (ref 65–99)

## 2016-12-22 LAB — CBC WITH DIFFERENTIAL/PLATELET
BASOS PCT: 1 %
Basophils Absolute: 0 10*3/uL (ref 0.0–0.1)
Eosinophils Absolute: 0.1 10*3/uL (ref 0.0–0.7)
Eosinophils Relative: 5 %
HEMATOCRIT: 41 % (ref 39.0–52.0)
Hemoglobin: 13.5 g/dL (ref 13.0–17.0)
LYMPHS PCT: 19 %
Lymphs Abs: 0.6 10*3/uL — ABNORMAL LOW (ref 0.7–4.0)
MCH: 27.4 pg (ref 26.0–34.0)
MCHC: 32.9 g/dL (ref 30.0–36.0)
MCV: 83.2 fL (ref 78.0–100.0)
MONO ABS: 0.3 10*3/uL (ref 0.1–1.0)
MONOS PCT: 10 %
NEUTROS ABS: 2.1 10*3/uL (ref 1.7–7.7)
Neutrophils Relative %: 65 %
Platelets: 170 10*3/uL (ref 150–400)
RBC: 4.93 MIL/uL (ref 4.22–5.81)
RDW: 15.2 % (ref 11.5–15.5)
WBC: 3.1 10*3/uL — ABNORMAL LOW (ref 4.0–10.5)

## 2016-12-22 LAB — PROTIME-INR
INR: 1.11
Prothrombin Time: 14.3 seconds (ref 11.4–15.2)

## 2016-12-22 LAB — BASIC METABOLIC PANEL
ANION GAP: 7 (ref 5–15)
BUN: 16 mg/dL (ref 6–20)
CALCIUM: 9.4 mg/dL (ref 8.9–10.3)
CO2: 28 mmol/L (ref 22–32)
Chloride: 104 mmol/L (ref 101–111)
Creatinine, Ser: 1.2 mg/dL (ref 0.61–1.24)
GFR calc Af Amer: >60 mL/min (ref >60)
GFR calc non Af Amer: 59 mL/min — ABNORMAL LOW (ref >60)
GLUCOSE: 152 mg/dL — AB (ref 65–99)
Potassium: 3.6 mmol/L (ref 3.5–5.1)
Sodium: 139 mmol/L (ref 135–145)

## 2016-12-22 LAB — TYPE AND SCREEN
ABO/RH(D): A POS
Antibody Screen: NEGATIVE

## 2016-12-22 LAB — ABO/RH: ABO/RH(D): A POS

## 2016-12-22 LAB — SURGICAL PCR SCREEN
MRSA, PCR: NEGATIVE
Staphylococcus aureus: POSITIVE — AB

## 2016-12-22 LAB — APTT: aPTT: 47 seconds — ABNORMAL HIGH (ref 24–36)

## 2016-12-23 LAB — HEMOGLOBIN A1C
HEMOGLOBIN A1C: 7.7 % — AB (ref 4.8–5.6)
MEAN PLASMA GLUCOSE: 174 mg/dL

## 2016-12-27 NOTE — Progress Notes (Addendum)
Anesthesia Chart Review:  Pt is a 71 year old male scheduled for L total hip arthroplasty anterior approach on 01/03/2017 with Marcene CorningPeter Dalldorf, MD.   - Nephrologist is Casimiro NeedleAlvin [name], MD, last office visit 09/23/16 - Cardiologist is Rinaldo CloudMohan Harwani, MD, who cleared pt for surgery.   PMH includes:  CAD, CHF, heart murmur, HTN, DM, hyperlipidemia, CKD (s/p kidney transplant 2014), OSA, anemia, GERD. Former smoker BMI 29  Medications include: ASA, carvedilol, clonidine, doxazosin, lantus, humalog, bidil, lisinopril, nifedipine, protonix, potassium, prograf, crestor.   Preoperative labs reviewed.   - HgbA1c 7.7, glucose 152.  - UA consistent with UTI.  - PT normal, PTT 47. Will repeat PTT DOS - I notified Olegario MessierKathy in Dr. Nolon Nationsalldorf's office of elevated PTT and UTI.   CXR 12/22/16: No edema or consolidation.  Aortic atherosclerosis.  EKG 12/22/16: NSR. Probable LVH with secondary repolarization abnormalities. Nonspecific T wave abnormality.   Nuclear stress test 04/03/12 (care everywhere): No evidence of fixedor reversible perfusion defects. Normal ventricular wall motion with estimated ventricular ejection fraction of 67 %.  Stress echo 03/01/12:  - The patient had no chest pain during stress - The patient achieved 50 % of maximum predicted heart rate. - Normal left ventricular function. Estimated LV ejection fraction is 55-60%. There were no segmental wall motion abnormalities at rest. There was normal augmentation of all left ventricular wall segments with dobutamine. - Nondiagnostic dobutamine echocardiography due to subtarget heart rate  Echo 03/01/12:  - Normal LV systolic function. Concentric LVH. LV ejection fraction = 54% - Physiologic TR, MR and PR - Trivial pericardial effusion - Impaired LV relaxation.  Cardiac cath 03/19/02:  1. LM patent. 2. LAD  has 20-30% mid and distal stenosis. Diagonal #1 was small which was patent.  Diagonal #2 and #3 were very small which were patent. 3. CX  patent.  Obtuse marginal #1 has mild disease. Obtuse marginal #2 was patent. 4. RCA was patent and had 10-20% distal disease.  If labs acceptable DOS, I anticipate pt can proceed with surgery as scheduled.   Rica Mastngela Alexxander Kurt, FNP-BC [facility]/Anesthesiology Phone: [phone] 12/29/2016 4:35 PM
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
## 2017-01-02 NOTE — Anesthesia Preprocedure Evaluation (Signed)
Anesthesia Evaluation  Patient identified by MRN, date of birth, ID band Patient awake    Reviewed: Allergy & Precautions, H&P , Patient's Chart, lab work & pertinent test results  Airway Mallampati: II  TM Distance: >3 FB Neck ROM: full    Dental no notable dental hx.    Pulmonary former smoker,    Pulmonary exam normal breath sounds clear to auscultation       Cardiovascular Exercise Tolerance: Good hypertension,  Rhythm:regular Rate:Normal     Neuro/Psych    GI/Hepatic   Endo/Other  diabetes  Renal/GU      Musculoskeletal   Abdominal   Peds  Hematology   Anesthesia Other Findings   Reproductive/Obstetrics                             Anesthesia Physical Anesthesia Plan  ASA: II  Anesthesia Plan: Spinal   Post-op Pain Management:    Induction:   Airway Management Planned:   Additional Equipment:   Intra-op Plan:   Post-operative Plan:   Informed Consent: I have reviewed the patients History and Physical, chart, labs and discussed the procedure including the risks, benefits and alternatives for the proposed anesthesia with the patient or authorized representative who has indicated his/her understanding and acceptance.   Dental Advisory Given  Plan Discussed with: CRNA  Anesthesia Plan Comments: (Lab work and procedure  confirmed with CRNA in room; platelets okay. Discussed spinal anesthetic, and patient consents to the procedure:  included risk of possible headache,backache, failed block, allergic reaction, and nerve injury. This patient was asked if she had any questions or concerns before the procedure started.  )        Anesthesia Quick Evaluation

## 2017-01-03 ENCOUNTER — Inpatient Hospital Stay (HOSPITAL_COMMUNITY)
Admission: RE | Admit: 2017-01-03 | Discharge: 2017-01-06 | DRG: 470 | Disposition: A | Discharge status: Home Health | Payer: Medicare Other | Source: Ambulatory Visit | Attending: Orthopaedic Surgery | Admitting: Orthopaedic Surgery

## 2017-01-03 ENCOUNTER — Encounter (HOSPITAL_COMMUNITY)
Admission: RE | Disposition: A | Discharge status: Home Health | Payer: Self-pay | Source: Ambulatory Visit | Attending: Orthopaedic Surgery

## 2017-01-03 ENCOUNTER — Inpatient Hospital Stay (HOSPITAL_COMMUNITY): Payer: Medicare Other | Admitting: Emergency Medicine

## 2017-01-03 ENCOUNTER — Encounter (HOSPITAL_COMMUNITY): Payer: Self-pay | Admitting: Urology

## 2017-01-03 ENCOUNTER — Inpatient Hospital Stay (HOSPITAL_COMMUNITY): Payer: Medicare Other

## 2017-01-03 DIAGNOSIS — G4733 Obstructive sleep apnea (adult) (pediatric): Secondary | ICD-10-CM | POA: Diagnosis present

## 2017-01-03 DIAGNOSIS — E785 Hyperlipidemia, unspecified: Secondary | ICD-10-CM | POA: Diagnosis present

## 2017-01-03 DIAGNOSIS — M1612 Unilateral primary osteoarthritis, left hip: Principal | ICD-10-CM | POA: Diagnosis present

## 2017-01-03 DIAGNOSIS — Z419 Encounter for procedure for purposes other than remedying health state, unspecified: Secondary | ICD-10-CM

## 2017-01-03 DIAGNOSIS — E669 Obesity, unspecified: Secondary | ICD-10-CM | POA: Diagnosis present

## 2017-01-03 DIAGNOSIS — I2510 Atherosclerotic heart disease of native coronary artery without angina pectoris: Secondary | ICD-10-CM | POA: Diagnosis present

## 2017-01-03 DIAGNOSIS — K219 Gastro-esophageal reflux disease without esophagitis: Secondary | ICD-10-CM | POA: Diagnosis present

## 2017-01-03 DIAGNOSIS — E119 Type 2 diabetes mellitus without complications: Secondary | ICD-10-CM

## 2017-01-03 DIAGNOSIS — Z940 Kidney transplant status: Secondary | ICD-10-CM | POA: Diagnosis not present

## 2017-01-03 DIAGNOSIS — Z87891 Personal history of nicotine dependence: Secondary | ICD-10-CM | POA: Diagnosis not present

## 2017-01-03 DIAGNOSIS — Z888 Allergy status to other drugs, medicaments and biological substances status: Secondary | ICD-10-CM | POA: Diagnosis not present

## 2017-01-03 DIAGNOSIS — Z6827 Body mass index (BMI) 27.0-27.9, adult: Secondary | ICD-10-CM

## 2017-01-03 HISTORY — PX: TOTAL HIP ARTHROPLASTY: SHX124

## 2017-01-03 LAB — GLUCOSE, CAPILLARY
GLUCOSE-CAPILLARY: 160 mg/dL — AB (ref 65–99)
GLUCOSE-CAPILLARY: 195 mg/dL — AB (ref 65–99)
GLUCOSE-CAPILLARY: 160 mg/dL — AB (ref 65–99)
Glucose-Capillary: 132 mg/dL — ABNORMAL HIGH (ref 65–99)
Glucose-Capillary: 165 mg/dL — ABNORMAL HIGH (ref 65–99)

## 2017-01-03 LAB — APTT: aPTT: 41 seconds — ABNORMAL HIGH (ref 24–36)

## 2017-01-03 SURGERY — ARTHROPLASTY, HIP, TOTAL, ANTERIOR APPROACH
Anesthesia: Spinal | Site: Hip | Laterality: Left

## 2017-01-03 MED ORDER — DEXTROSE 5 % IV SOLN
500.0000 mg | Freq: Four times a day (QID) | INTRAVENOUS | Status: DC | PRN
  Filled 2017-01-03: qty 5

## 2017-01-03 MED ORDER — CHLORHEXIDINE GLUCONATE 4 % EX LIQD: 60.0000 mL | Freq: Once | CUTANEOUS | Status: DC

## 2017-01-03 MED ORDER — ONDANSETRON HCL 4 MG/2ML IJ SOLN: 4.0000 mg | Freq: Four times a day (QID) | INTRAMUSCULAR | Status: DC | PRN

## 2017-01-03 MED ORDER — MYCOPHENOLATE SODIUM 180 MG PO TBEC
360.0000 mg | DELAYED_RELEASE_TABLET | Freq: Two times a day (BID) | ORAL | Status: DC
  Administered 2017-01-03 – 2017-01-05 (×5): 360 mg via ORAL
  Filled 2017-01-03 (×6): qty 2

## 2017-01-03 MED ORDER — TRANEXAMIC ACID 1000 MG/10ML IV SOLN
1000.0000 mg | INTRAVENOUS | Status: AC
  Administered 2017-01-03: 1000 mg via INTRAVENOUS
  Filled 2017-01-03: qty 10

## 2017-01-03 MED ORDER — LATANOPROST 0.005 % OP SOLN
1.0000 [drp] | Freq: Every day | OPHTHALMIC | Status: DC
  Administered 2017-01-03 – 2017-01-05 (×3): 1 [drp] via OPHTHALMIC
  Filled 2017-01-03: qty 2.5

## 2017-01-03 MED ORDER — DIPHENHYDRAMINE HCL 12.5 MG/5ML PO ELIX: 12.5000 mg | ORAL_SOLUTION | ORAL | Status: DC | PRN

## 2017-01-03 MED ORDER — CARVEDILOL 25 MG PO TABS
25.0000 mg | ORAL_TABLET | Freq: Two times a day (BID) | ORAL | Status: DC
  Administered 2017-01-03 – 2017-01-05 (×5): 25 mg via ORAL
  Filled 2017-01-03 (×5): qty 1

## 2017-01-03 MED ORDER — MENTHOL 3 MG MT LOZG: 1.0000 | LOZENGE | OROMUCOSAL | Status: DC | PRN

## 2017-01-03 MED ORDER — BUPIVACAINE LIPOSOME 1.3 % IJ SUSP
20.0000 mL | INTRAMUSCULAR | Status: AC
  Administered 2017-01-03: 20 mL
  Filled 2017-01-03: qty 20

## 2017-01-03 MED ORDER — PHENOL 1.4 % MT LIQD: 1.0000 | OROMUCOSAL | Status: DC | PRN

## 2017-01-03 MED ORDER — TACROLIMUS 1 MG PO CAPS: 2.0000 mg | ORAL_CAPSULE | Freq: Two times a day (BID) | ORAL | Status: DC

## 2017-01-03 MED ORDER — MAGNESIUM OXIDE 400 (241.3 MG) MG PO TABS
400.0000 mg | ORAL_TABLET | ORAL | Status: DC
  Administered 2017-01-04 – 2017-01-05 (×2): 400 mg via ORAL
  Filled 2017-01-03 (×2): qty 1

## 2017-01-03 MED ORDER — CEFAZOLIN SODIUM-DEXTROSE 2-4 GM/100ML-% IV SOLN
2.0000 g | Freq: Four times a day (QID) | INTRAVENOUS | Status: AC
  Administered 2017-01-03 (×2): 2 g via INTRAVENOUS
  Filled 2017-01-03 (×2): qty 100

## 2017-01-03 MED ORDER — INSULIN GLARGINE 100 UNIT/ML ~~LOC~~ SOLN
20.0000 [IU] | Freq: Two times a day (BID) | SUBCUTANEOUS | Status: DC
  Administered 2017-01-03 – 2017-01-04 (×4): 20 [IU] via SUBCUTANEOUS
  Administered 2017-01-05: 10 [IU] via SUBCUTANEOUS
  Administered 2017-01-05: 15 [IU] via SUBCUTANEOUS
  Filled 2017-01-03 (×7): qty 0.2

## 2017-01-03 MED ORDER — PANTOPRAZOLE SODIUM 40 MG PO TBEC
40.0000 mg | DELAYED_RELEASE_TABLET | Freq: Two times a day (BID) | ORAL | Status: DC
  Administered 2017-01-03 – 2017-01-05 (×5): 40 mg via ORAL
  Filled 2017-01-03 (×5): qty 1

## 2017-01-03 MED ORDER — SODIUM CHLORIDE 0.9 % IV SOLN
INTRAVENOUS | Status: DC | PRN
  Administered 2017-01-03: 07:00:00 via INTRAVENOUS

## 2017-01-03 MED ORDER — FENTANYL CITRATE (PF) 100 MCG/2ML IJ SOLN
25.0000 ug | INTRAMUSCULAR | Status: DC | PRN
Start: 2017-01-03 — End: 2017-01-03
  Administered 2017-01-03 (×3): 50 ug via INTRAVENOUS

## 2017-01-03 MED ORDER — ONDANSETRON HCL 4 MG PO TABS: 4.0000 mg | ORAL_TABLET | Freq: Four times a day (QID) | ORAL | Status: DC | PRN

## 2017-01-03 MED ORDER — HYDROCODONE-ACETAMINOPHEN 5-325 MG PO TABS
ORAL_TABLET | ORAL | Status: AC
  Filled 2017-01-03: qty 2

## 2017-01-03 MED ORDER — TRANEXAMIC ACID 1000 MG/10ML IV SOLN
1000.0000 mg | Freq: Once | INTRAVENOUS | Status: AC
  Administered 2017-01-03: 1000 mg via INTRAVENOUS
  Filled 2017-01-03: qty 10

## 2017-01-03 MED ORDER — LIDOCAINE HCL (CARDIAC) 20 MG/ML IV SOLN
INTRAVENOUS | Status: DC | PRN
  Administered 2017-01-03: 40 mg via INTRATRACHEAL
  Administered 2017-01-03: 60 mg via INTRATRACHEAL

## 2017-01-03 MED ORDER — FENTANYL CITRATE (PF) 100 MCG/2ML IJ SOLN
INTRAMUSCULAR | Status: AC
  Filled 2017-01-03: qty 2

## 2017-01-03 MED ORDER — MIDAZOLAM HCL 5 MG/5ML IJ SOLN
INTRAMUSCULAR | Status: DC | PRN
  Administered 2017-01-03: 1 mg via INTRAVENOUS

## 2017-01-03 MED ORDER — ISOSORB DINITRATE-HYDRALAZINE 20-37.5 MG PO TABS
2.0000 | ORAL_TABLET | Freq: Three times a day (TID) | ORAL | Status: DC
  Administered 2017-01-03 – 2017-01-05 (×8): 2 via ORAL
  Filled 2017-01-03 (×9): qty 2

## 2017-01-03 MED ORDER — PROPOFOL 10 MG/ML IV BOLUS
INTRAVENOUS | Status: AC
  Filled 2017-01-03: qty 20

## 2017-01-03 MED ORDER — NIFEDIPINE ER OSMOTIC RELEASE 90 MG PO TB24
90.0000 mg | ORAL_TABLET | Freq: Every day | ORAL | Status: DC
  Administered 2017-01-04 – 2017-01-05 (×2): 90 mg via ORAL
  Filled 2017-01-03 (×3): qty 1

## 2017-01-03 MED ORDER — DEXTROSE 5 % IV SOLN
500.0000 mg | INTRAVENOUS | Status: AC
  Administered 2017-01-03: 500 mg via INTRAVENOUS
  Filled 2017-01-03: qty 5

## 2017-01-03 MED ORDER — BUPIVACAINE-EPINEPHRINE (PF) 0.5% -1:200000 IJ SOLN
INTRAMUSCULAR | Status: DC | PRN
  Administered 2017-01-03: 20 mL via PERINEURAL

## 2017-01-03 MED ORDER — CEFAZOLIN SODIUM-DEXTROSE 2-4 GM/100ML-% IV SOLN
INTRAVENOUS | Status: AC
  Filled 2017-01-03: qty 100

## 2017-01-03 MED ORDER — DOCUSATE SODIUM 100 MG PO CAPS
100.0000 mg | ORAL_CAPSULE | Freq: Two times a day (BID) | ORAL | Status: DC
  Administered 2017-01-03 – 2017-01-05 (×6): 100 mg via ORAL
  Filled 2017-01-03 (×6): qty 1

## 2017-01-03 MED ORDER — ROSUVASTATIN CALCIUM 10 MG PO TABS
20.0000 mg | ORAL_TABLET | ORAL | Status: DC
  Administered 2017-01-04: 20 mg via ORAL
  Filled 2017-01-03: qty 2

## 2017-01-03 MED ORDER — ALUM & MAG HYDROXIDE-SIMETH 200-200-20 MG/5ML PO SUSP: 30.0000 mL | ORAL | Status: DC | PRN

## 2017-01-03 MED ORDER — SODIUM CHLORIDE 0.9 % IV SOLN
2000.0000 mg | INTRAVENOUS | Status: AC
  Administered 2017-01-03: 2000 mg via TOPICAL
  Filled 2017-01-03: qty 20

## 2017-01-03 MED ORDER — METOCLOPRAMIDE HCL 5 MG PO TABS: 5.0000 mg | ORAL_TABLET | Freq: Three times a day (TID) | ORAL | Status: DC | PRN

## 2017-01-03 MED ORDER — 0.9 % SODIUM CHLORIDE (POUR BTL) OPTIME
TOPICAL | Status: DC | PRN
  Administered 2017-01-03: 1000 mL

## 2017-01-03 MED ORDER — BISACODYL 5 MG PO TBEC: 5.0000 mg | DELAYED_RELEASE_TABLET | Freq: Every day | ORAL | Status: DC | PRN

## 2017-01-03 MED ORDER — ACETAMINOPHEN 650 MG RE SUPP
650.0000 mg | Freq: Four times a day (QID) | RECTAL | Status: DC | PRN
Start: 2017-01-03 — End: 2017-01-06

## 2017-01-03 MED ORDER — TACROLIMUS 0.5 MG PO CAPS
2.5000 mg | ORAL_CAPSULE | Freq: Two times a day (BID) | ORAL | Status: DC
  Administered 2017-01-03 – 2017-01-05 (×5): 2.5 mg via ORAL
  Filled 2017-01-03 (×6): qty 5

## 2017-01-03 MED ORDER — METOCLOPRAMIDE HCL 5 MG/ML IJ SOLN: 5.0000 mg | Freq: Three times a day (TID) | INTRAMUSCULAR | Status: DC | PRN

## 2017-01-03 MED ORDER — LACTATED RINGERS IV SOLN: INTRAVENOUS | Status: DC

## 2017-01-03 MED ORDER — SULFAMETHOXAZOLE-TRIMETHOPRIM 400-80 MG PO TABS
1.0000 | ORAL_TABLET | ORAL | Status: DC
  Administered 2017-01-04: 1 via ORAL
  Filled 2017-01-03 (×2): qty 1

## 2017-01-03 MED ORDER — ASPIRIN EC 325 MG PO TBEC
325.0000 mg | DELAYED_RELEASE_TABLET | Freq: Two times a day (BID) | ORAL | Status: DC
  Administered 2017-01-04 – 2017-01-05 (×4): 325 mg via ORAL
  Filled 2017-01-03 (×4): qty 1

## 2017-01-03 MED ORDER — MIDAZOLAM HCL 2 MG/2ML IJ SOLN
INTRAMUSCULAR | Status: AC
  Filled 2017-01-03: qty 2

## 2017-01-03 MED ORDER — BUPIVACAINE HCL (PF) 0.5 % IJ SOLN
INTRAMUSCULAR | Status: AC
  Filled 2017-01-03: qty 30

## 2017-01-03 MED ORDER — CLONIDINE HCL 0.2 MG PO TABS
0.2000 mg | ORAL_TABLET | Freq: Two times a day (BID) | ORAL | Status: DC
  Administered 2017-01-03 – 2017-01-05 (×5): 0.2 mg via ORAL
  Filled 2017-01-03 (×5): qty 1

## 2017-01-03 MED ORDER — POTASSIUM CHLORIDE CRYS ER 20 MEQ PO TBCR
20.0000 meq | EXTENDED_RELEASE_TABLET | Freq: Four times a day (QID) | ORAL | Status: DC
  Administered 2017-01-03 – 2017-01-05 (×11): 20 meq via ORAL
  Filled 2017-01-03 (×11): qty 1

## 2017-01-03 MED ORDER — HYDROCODONE-ACETAMINOPHEN 5-325 MG PO TABS
1.0000 | ORAL_TABLET | ORAL | Status: DC | PRN
  Administered 2017-01-03 – 2017-01-04 (×5): 2 via ORAL
  Administered 2017-01-04: 1 via ORAL
  Administered 2017-01-05 (×2): 2 via ORAL
  Filled 2017-01-03 (×5): qty 2
  Filled 2017-01-03: qty 1
  Filled 2017-01-03: qty 2

## 2017-01-03 MED ORDER — DOXAZOSIN MESYLATE 8 MG PO TABS
8.0000 mg | ORAL_TABLET | Freq: Two times a day (BID) | ORAL | Status: DC
  Administered 2017-01-03 – 2017-01-05 (×5): 8 mg via ORAL
  Filled 2017-01-03 (×6): qty 1

## 2017-01-03 MED ORDER — CIPROFLOXACIN HCL 500 MG PO TABS
250.0000 mg | ORAL_TABLET | Freq: Two times a day (BID) | ORAL | Status: DC
  Administered 2017-01-03: 250 mg via ORAL
  Filled 2017-01-03: qty 1

## 2017-01-03 MED ORDER — CEFAZOLIN SODIUM-DEXTROSE 2-4 GM/100ML-% IV SOLN
2.0000 g | INTRAVENOUS | Status: AC
  Administered 2017-01-03: 2 g via INTRAVENOUS

## 2017-01-03 MED ORDER — LISINOPRIL 10 MG PO TABS
10.0000 mg | ORAL_TABLET | Freq: Every day | ORAL | Status: DC
  Administered 2017-01-04 – 2017-01-05 (×2): 10 mg via ORAL
  Filled 2017-01-03 (×2): qty 1

## 2017-01-03 MED ORDER — MAGNESIUM OXIDE 400 (241.3 MG) MG PO TABS: 400.0000 mg | ORAL_TABLET | Freq: Every day | ORAL | Status: DC

## 2017-01-03 MED ORDER — PROPOFOL 500 MG/50ML IV EMUL
INTRAVENOUS | Status: DC | PRN
  Administered 2017-01-03: 100 ug/kg/min via INTRAVENOUS

## 2017-01-03 MED ORDER — INSULIN ASPART 100 UNIT/ML ~~LOC~~ SOLN
0.0000 [IU] | Freq: Three times a day (TID) | SUBCUTANEOUS | Status: DC
  Administered 2017-01-03 – 2017-01-05 (×5): 4 [IU] via SUBCUTANEOUS
  Administered 2017-01-06: 3 [IU] via SUBCUTANEOUS

## 2017-01-03 MED ORDER — METHOCARBAMOL 500 MG PO TABS
500.0000 mg | ORAL_TABLET | Freq: Four times a day (QID) | ORAL | Status: DC | PRN
  Administered 2017-01-03 – 2017-01-05 (×4): 500 mg via ORAL
  Filled 2017-01-03 (×4): qty 1

## 2017-01-03 MED ORDER — ACETAMINOPHEN 325 MG PO TABS: 650.0000 mg | ORAL_TABLET | Freq: Four times a day (QID) | ORAL | Status: DC | PRN

## 2017-01-03 MED ORDER — HYDROMORPHONE HCL 2 MG/ML IJ SOLN
0.5000 mg | INTRAMUSCULAR | Status: DC | PRN
  Administered 2017-01-03 (×2): 1 mg via INTRAVENOUS
  Filled 2017-01-03 (×2): qty 1

## 2017-01-03 MED ORDER — LACTATED RINGERS IV SOLN
INTRAVENOUS | Status: DC
  Administered 2017-01-03 – 2017-01-05 (×2): via INTRAVENOUS

## 2017-01-03 MED ORDER — NEOSTIGMINE METHYLSULFATE 5 MG/5ML IV SOSY
PREFILLED_SYRINGE | INTRAVENOUS | Status: AC
  Filled 2017-01-03: qty 5

## 2017-01-03 SURGICAL SUPPLY — 46 items
BAG DECANTER FOR FLEXI CONT (MISCELLANEOUS) ×3 IMPLANT
BLADE SAW SGTL 18X1.27X75 (BLADE) ×2 IMPLANT
BLADE SAW SGTL 18X1.27X75MM (BLADE) ×1
CAPT HIP TOTAL 2 ×3 IMPLANT
CELLS DAT CNTRL 66122 CELL SVR (MISCELLANEOUS) ×1 IMPLANT
CLOSURE WOUND 1/2 X4 (GAUZE/BANDAGES/DRESSINGS) ×1
COVER PERINEAL POST (MISCELLANEOUS) ×3 IMPLANT
COVER SURGICAL LIGHT HANDLE (MISCELLANEOUS) ×3 IMPLANT
DRAPE C-ARM 42X72 X-RAY (DRAPES) ×3 IMPLANT
DRAPE STERI IOBAN 125X83 (DRAPES) ×3 IMPLANT
DRAPE U-SHAPE 47X51 STRL (DRAPES) ×9 IMPLANT
DRSG AQUACEL AG ADV 3.5X10 (GAUZE/BANDAGES/DRESSINGS) ×3 IMPLANT
DURAPREP 26ML APPLICATOR (WOUND CARE) ×3 IMPLANT
ELECT BLADE 4.0 EZ CLEAN MEGAD (MISCELLANEOUS) ×3
ELECT REM PT RETURN 9FT ADLT (ELECTROSURGICAL) ×3
ELECTRODE BLDE 4.0 EZ CLN MEGD (MISCELLANEOUS) ×1 IMPLANT
ELECTRODE REM PT RTRN 9FT ADLT (ELECTROSURGICAL) ×1 IMPLANT
FACESHIELD WRAPAROUND (MASK) ×9 IMPLANT
GLOVE BIO SURGEON STRL SZ8 (GLOVE) ×6 IMPLANT
GLOVE BIOGEL PI IND STRL 8 (GLOVE) ×2 IMPLANT
GLOVE BIOGEL PI INDICATOR 8 (GLOVE) ×4
GOWN STRL REUS W/ TWL LRG LVL3 (GOWN DISPOSABLE) ×1 IMPLANT
GOWN STRL REUS W/ TWL XL LVL3 (GOWN DISPOSABLE) ×2 IMPLANT
GOWN STRL REUS W/TWL LRG LVL3 (GOWN DISPOSABLE) ×3
GOWN STRL REUS W/TWL XL LVL3 (GOWN DISPOSABLE) ×6
KIT BASIN OR (CUSTOM PROCEDURE TRAY) ×3 IMPLANT
KIT ROOM TURNOVER OR (KITS) ×3 IMPLANT
MANIFOLD NEPTUNE II (INSTRUMENTS) ×3 IMPLANT
NEEDLE HYPO 22GX1.5 SAFETY (NEEDLE) ×3 IMPLANT
NS IRRIG 1000ML POUR BTL (IV SOLUTION) ×3 IMPLANT
PACK TOTAL JOINT (CUSTOM PROCEDURE TRAY) ×3 IMPLANT
PAD ARMBOARD 7.5X6 YLW CONV (MISCELLANEOUS) ×6 IMPLANT
RTRCTR WOUND ALEXIS 18CM MED (MISCELLANEOUS) ×3
STRIP CLOSURE SKIN 1/2X4 (GAUZE/BANDAGES/DRESSINGS) ×2 IMPLANT
SUT ETHIBOND NAB CT1 #1 30IN (SUTURE) ×6 IMPLANT
SUT MNCRL AB 3-0 PS2 18 (SUTURE) ×3 IMPLANT
SUT MNCRL AB 3-0 PS2 27 (SUTURE) ×3 IMPLANT
SUT VIC AB 1 CT1 27 (SUTURE) ×3
SUT VIC AB 1 CT1 27XBRD ANBCTR (SUTURE) ×1 IMPLANT
SUT VIC AB 2-0 CT1 27 (SUTURE) ×3
SUT VIC AB 2-0 CT1 TAPERPNT 27 (SUTURE) ×1 IMPLANT
SUT VLOC 180 0 24IN GS25 (SUTURE) ×3 IMPLANT
SYR 50ML LL SCALE MARK (SYRINGE) ×3 IMPLANT
TOWEL OR 17X24 6PK STRL BLUE (TOWEL DISPOSABLE) ×3 IMPLANT
TOWEL OR 17X26 10 PK STRL BLUE (TOWEL DISPOSABLE) ×3 IMPLANT
TRAY CATH 16FR W/PLASTIC CATH (SET/KITS/TRAYS/PACK) IMPLANT

## 2017-01-03 NOTE — Interval H&P Note (Signed)
History and Physical Interval Note:  01/03/2017 7:11 AM  Cameron [name]  has presented today for surgery, with the diagnosis of LEFT HIP DEGENERATIVE JOINT DISEASE  The various methods of treatment have been discussed with the patient and family. After consideration of risks, benefits and other options for treatment, the patient has consented to  Procedure(s): TOTAL HIP ARTHROPLASTY ANTERIOR APPROACH (Left) as a surgical intervention .  The patient's history has been reviewed, patient examined, no change in status, stable for surgery.  I have reviewed the patient's chart and labs.  Questions were answered to the patient's satisfaction.     Nereyda Bowler G

## 2017-01-03 NOTE — Op Note (Signed)
PRE-OP DIAGNOSIS:  LEFT HIP DEGENERATIVE JOINT DISEASE POST-OP DIAGNOSIS: same PROCEDURE:  LEFT TOTAL HIP ARTHROPLASTY ANTERIOR APPROACH ANESTHESIA:  Spinal and MAC SURGEON:  Melrose Nakayama MD ASSISTANT:  Loni Dolly PA-C   INDICATIONS FOR PROCEDURE:  The patient is a 71 y.o. male with a long history of a painful hip.  This has persisted despite multiple conservative measures.  The patient has persisted with pain and dysfunction making rest and activity difficult.  A total hip replacement is offered as surgical treatment.  Informed operative consent was obtained after discussion of possible complications including reaction to anesthesia, infection, neurovascular injury, dislocation, DVT, PE, and death.  The importance of the postoperative rehab program to optimize result was stressed with the patient.  SUMMARY OF FINDINGS AND PROCEDURE:  Under general anesthesia through a anterior approach an the Hana table a left THR was performed.  The patient had severe degenerative change and excellent bone quality.  We used DePuy components to replace the hip and these were size KA 11 Corail femur capped with a +5 97m ceramic hip ball.  On the acetabular side we used a size 52 Gription shell with a plus 4 neutral polyethylene liner.  We did use a hole eliminator.  ALoni DollyPA-C assisted throughout and was invaluable to the completion of the case in that he helped position and retract while I performed the procedure.  He also closed simultaneously to help minimize OR time.  I used fluoroscopy throughout the case to check position of components and leg lengths and read all these views myself.  DESCRIPTION OF PROCEDURE:  The patient was taken to the OR suite where general anesthetic was applied.  The patient was then positioned on the Hana table supine.  All bony prominences were appropriately padded.  Prep and drape was then performed in normal sterile fashion.  The patient was given kefzol preoperative antibiotic  and an appropriate time out was performed.  We then took an anterior approach to the left hip.  Dissection was taken through adipose to the tensor fascia lata fascia.  This structure was incised longitudinally and we dissected in the intermuscular interval just medial to this muscle.  Cobra retractors were placed superior and inferior to the femoral neck superficial to the capsule.  A capsular incision was then made and the retractors were placed along the femoral neck.  Xray was brought in to get a good level for the femoral neck cut which was made with an oscillating saw and osteotome.  The femoral head was removed with a corkscrew.  The acetabulum was exposed and some labral tissues were excised. Reaming was taken to the inside wall of the pelvis and sequentially up to 1 mm smaller than the actual component.  A trial of components was done and then the aforementioned acetabular shell was placed in appropriate tilt and anteversion confirmed by fluoroscopy. The liner was placed along with the hole eliminator and attention was turned to the femur.  The leg was brought down and over into adduction and the elevator bar was used to raise the femur up gently in the wound.  The piriformis was released with care taken to preserve the obturator internus attachment and all of the posterior capsule. The femur was reamed and then broached to the appropriate size. This was a bit difficult and required curettes due to some sclerotic bone in the canal.  A trial reduction was done and the aforementioned head and neck assembly gave uKoreathe best stability in  extension with external rotation.  Leg lengths were felt to be about equal by fluoroscopic exam.  The trial components were removed and the wound irrigated.  We then placed the femoral component in appropriate anteversion.  The head was applied to a dry stem neck and the hip again reduced.  It was again stable in the aforementioned position.  The would was irrigated again  followed by re-approximation of anterior capsule with ethibond suture. Tensor fascia was repaired with V-loc suture  followed by deep closure with #O and #2 undyed vicryl.  Skin was closed with subQ stitch and steristrips followed by a sterile dressing.  EBL and IOF can be obtained from anesthesia records.  DISPOSITION:  The patient was extubated in the OR and taken to PACU in stable condition to be admitted to the Orthopedic Surgery for appropriate post-op care to include perioperative antibiotics and DVT prophylaxis.

## 2017-01-03 NOTE — Anesthesia Procedure Notes (Addendum)
Spinal  Patient location during procedure: OR Start time: 01/03/2017 7:34 AM End time: 01/03/2017 7:39 AM Preanesthetic Checklist Completed: patient identified, site marked, surgical consent, pre-op evaluation, timeout performed, IV checked, risks and benefits discussed and monitors and equipment checked Spinal Block Patient position: sitting Prep: DuraPrep Patient monitoring: heart rate, cardiac monitor, continuous pulse ox and blood pressure Approach: midline Location: L3-4 Injection technique: single-shot Needle Needle type: Sprotte  Needle gauge: 24 G Needle length: 9 cm Assessment Sensory level: T4 Additional Notes Spinal Dosage in OR  Bupivicaine ml       1.9

## 2017-01-03 NOTE — Transfer of Care (Signed)
Immediate Anesthesia Transfer of Care Note  Patient: Cameron [name]  Procedure(s) Performed: Procedure(s): TOTAL HIP ARTHROPLASTY ANTERIOR APPROACH (Left)  Patient Location: PACU  Anesthesia Type:MAC and Spinal  Level of Consciousness: awake, alert , oriented and patient cooperative  Airway & Oxygen Therapy: Patient Spontanous Breathing and Patient connected to face mask oxygen  Post-op Assessment: Report given to RN and Post -op Vital signs reviewed and stable  Post vital signs: Reviewed and stable  Last Vitals:  Vitals:   01/03/17 0700 01/03/17 0957  BP: (!) 145/57   Pulse: 64   Resp: 20   Temp: 36.7 C 36.1 C    Last Pain:  Vitals:   01/03/17 0700  TempSrc: Oral         Complications: No apparent anesthesia complications

## 2017-01-03 NOTE — Anesthesia Postprocedure Evaluation (Signed)
Anesthesia Post Note  Patient: Cameron [name]  Procedure(s) Performed: Procedure(s) (LRB): TOTAL HIP ARTHROPLASTY ANTERIOR APPROACH (Left)  Patient location during evaluation: PACU Anesthesia Type: Spinal Level of consciousness: awake Pain management: satisfactory to patient Vital Signs Assessment: post-procedure vital signs reviewed and stable Respiratory status: spontaneous breathing Cardiovascular status: blood pressure returned to baseline Postop Assessment: no headache and spinal receding Anesthetic complications: no       Last Vitals:  Vitals:   01/03/17 1300 01/03/17 1427  BP: (!) 153/65 (!) 160/65  Pulse: 66 71  Resp: 16   Temp: 36.4 C 36.4 C    Last Pain:  Vitals:   01/03/17 1549  TempSrc:   PainSc: 9                  Jehiel Koepp EDWARD

## 2017-01-03 NOTE — Evaluation (Signed)
Physical Therapy Evaluation Patient Details Name: Cameron [name] MRN: [id number] DOB: 08-06-1946 Today's Date: 01/03/2017   History of Present Illness  Pt admitted for L anterior THA. Pt with h/o kidney transplant in 2013.  Clinical Impression  Pt is s/p THA resulting in the deficits listed below (see PT Problem List).Pt lethargic/sleepy most likely due to medications and anesthesia but tolerated up and OOB to chair well. Pt will benefit from skilled PT to increase their independence and safety with mobility to allow discharge to the venue listed below.      Follow Up Recommendations Home health PT;Supervision/Assistance - 24 hour    Equipment Recommendations  Rolling walker with 5" wheels;3in1 (PT)    Recommendations for Other Services       Precautions / Restrictions Precautions Precautions: None Restrictions Weight Bearing Restrictions: Yes LLE Weight Bearing: Weight bearing as tolerated      Mobility  Bed Mobility Overal bed mobility: Needs Assistance Bed Mobility: Supine to Sit     Supine to sit: Min assist     General bed mobility comments: assist for L LE and trunk elevation  Transfers Overall transfer level: Needs assistance Equipment used: Rolling walker (2 wheeled) Transfers: Sit to/from Stand Sit to Stand: Min assist         General transfer comment: v/c's for hand placement, assist for intiatl power up  Ambulation/Gait Ambulation/Gait assistance: Min assist Ambulation Distance (Feet): 3 Feet (to chair) Assistive device: Rolling walker (2 wheeled) Gait Pattern/deviations: Step-to pattern;Decreased step length - right;Decreased stance time - right Gait velocity: slow Gait velocity interpretation: Below normal speed for age/gender General Gait Details: v/c's for sequencing  Stairs            Wheelchair Mobility    Modified Rankin (Stroke Patients Only)       Balance Overall balance assessment: No apparent balance deficits (not formally  assessed)                                           Pertinent Vitals/Pain Pain Assessment: 0-10 Pain Score: 6  Pain Location: L hip Pain Descriptors / Indicators: Sore Pain Intervention(s): Premedicated before session;Monitored during session    Home Living Family/patient expects to be discharged to:: Private residence Living Arrangements: Spouse/significant other Available Help at Discharge: Family;Available 24 hours/day Type of Home: House Home Access: Level entry     Home Layout: Two level Home Equipment: Hand held shower head;Shower seat      Prior Function Level of Independence: Independent               Hand Dominance   Dominant Hand: Right    Extremity/Trunk Assessment   Upper Extremity Assessment Upper Extremity Assessment: Overall WFL for tasks assessed    Lower Extremity Assessment Lower Extremity Assessment: LLE deficits/detail LLE Deficits / Details: able to complete quad set and AAROM of L hip flexion to 60 deg    Cervical / Trunk Assessment Cervical / Trunk Assessment: Normal  Communication   Communication: No difficulties  Cognition Arousal/Alertness: Lethargic Behavior During Therapy: WFL for tasks assessed/performed Overall Cognitive Status: Within Functional Limits for tasks assessed                      General Comments      Exercises Total Joint Exercises Ankle Circles/Pumps: AROM;Both;10 reps Quad Sets: AROM;Left;10 reps   Assessment/Plan  PT Assessment Patient needs continued PT services  PT Problem List Decreased strength;Decreased activity tolerance;Decreased balance;Decreased mobility;Decreased knowledge of use of DME          PT Treatment Interventions      PT Goals (Current goals can be found in the Care Plan section)  Acute Rehab PT Goals Patient Stated Goal: home PT Goal Formulation: With patient Time For Goal Achievement: 01/10/17 Potential to Achieve Goals: Good    Frequency  7X/week   Barriers to discharge        Co-evaluation               End of Session Equipment Utilized During Treatment: Gait belt Activity Tolerance: Patient tolerated treatment well Patient left: in chair;with call bell/phone within reach;with family/visitor present Nurse Communication: Mobility status         Time: [id number] PT Time Calculation (min) (ACUTE ONLY): 18 min   Charges:   PT Evaluation $PT Eval Moderate Complexity: 1 Procedure     PT G Codes:        Makensie Mulhall M Joniah Bednarski 01/03/2017, 4:00 PM   Lewis ShockAshly Briselda Naval, PT, DPT Pager #: [phone] Office #: [phone]

## 2017-01-04 ENCOUNTER — Encounter (HOSPITAL_COMMUNITY): Payer: Self-pay | Admitting: Orthopaedic Surgery

## 2017-01-04 LAB — GLUCOSE, CAPILLARY
GLUCOSE-CAPILLARY: 161 mg/dL — AB (ref 65–99)
GLUCOSE-CAPILLARY: 152 mg/dL — AB (ref 65–99)
Glucose-Capillary: 108 mg/dL — ABNORMAL HIGH (ref 65–99)
Glucose-Capillary: 133 mg/dL — ABNORMAL HIGH (ref 65–99)

## 2017-01-04 LAB — URINALYSIS, ROUTINE W REFLEX MICROSCOPIC
BACTERIA UA: NONE SEEN
BILIRUBIN URINE: NEGATIVE
Glucose, UA: >=500 mg/dL — AB
HGB URINE DIPSTICK: NEGATIVE
KETONES UR: NEGATIVE mg/dL
NITRITE: NEGATIVE
PROTEIN: NEGATIVE mg/dL
SPECIFIC GRAVITY, URINE: 1.021 (ref 1.005–1.030)
pH: 5 (ref 5.0–8.0)

## 2017-01-04 MED ORDER — NITROFURANTOIN MONOHYD MACRO 100 MG PO CAPS
100.0000 mg | ORAL_CAPSULE | Freq: Two times a day (BID) | ORAL | Status: DC
  Administered 2017-01-04 – 2017-01-05 (×3): 100 mg via ORAL
  Filled 2017-01-04 (×4): qty 1

## 2017-01-04 NOTE — Progress Notes (Signed)
Physical Therapy Treatment Patient Details Name: Cameron [name] MRN: [id number] DOB: August 23, 1946 Today's Date: 01/04/2017    History of Present Illness Pt admitted for L anterior THA. Pt with h/o kidney transplant in 2013.    PT Comments    Patient limited by pain and muscle cramps this session. Continue to progress as tolerated.   Follow Up Recommendations  Home health PT;Supervision/Assistance - 24 hour     Equipment Recommendations  Rolling walker with 5" wheels;3in1 (PT)    Recommendations for Other Services       Precautions / Restrictions Precautions Precautions: None Precaution Comments: direct anterior Restrictions Weight Bearing Restrictions: Yes LLE Weight Bearing: Weight bearing as tolerated    Mobility  Bed Mobility Overal bed mobility: Needs Assistance Bed Mobility: Sit to Supine       Sit to supine: Mod assist   General bed mobility comments: assist to bring bilat LE into bed and to position once in bed; cues for technique and use of rails  Transfers Overall transfer level: Needs assistance Equipment used: Rolling walker (2 wheeled) Transfers: Sit to/from Stand Sit to Stand: Min assist         General transfer comment: cues for hand placement; assist to power up into standing  Ambulation/Gait Ambulation/Gait assistance: Min guard Ambulation Distance (Feet): 25 Feet Assistive device: Rolling walker (2 wheeled) Gait Pattern/deviations: Decreased step length - right;Step-through pattern;Decreased stance time - left;Decreased weight shift to left;Antalgic;Trunk flexed Gait velocity: slow   General Gait Details: cues for posture; limited by R LE cramps   Stairs            Wheelchair Mobility    Modified Rankin (Stroke Patients Only)       Balance     Sitting balance-Leahy Scale: Fair       Standing balance-Leahy Scale: Poor                      Cognition Arousal/Alertness: Lethargic Behavior During Therapy: WFL  for tasks assessed/performed Overall Cognitive Status: Within Functional Limits for tasks assessed                 General Comments: Pt's wife in room for entire session.     Exercises Total Joint Exercises Heel Slides: AAROM;Left;10 reps Long Arc Quad: AROM;Left;10 reps Knee Flexion: AROM;Left;5 reps;Standing    General Comments        Pertinent Vitals/Pain Pain Assessment: 0-10 Pain Score: 7  Pain Location: L hip and R LE cramps Pain Descriptors / Indicators: Sore;Grimacing;Guarding;Cramping Pain Intervention(s): Limited activity within patient's tolerance;Monitored during session;Repositioned;Patient requesting pain meds-RN notified    Home Living                      Prior Function            PT Goals (current goals can now be found in the care plan section) Acute Rehab PT Goals Patient Stated Goal: home Progress towards PT goals: Progressing toward goals    Frequency    7X/week      PT Plan Current plan remains appropriate    Co-evaluation             End of Session Equipment Utilized During Treatment: Gait belt Activity Tolerance: Patient limited by pain;Patient limited by fatigue Patient left: with call bell/phone within reach;with family/visitor present;in bed;with SCD's reapplied     Time: 1535-1600 PT Time Calculation (min) (ACUTE ONLY): 25 min  Charges:  $Gait Training: 8-22  mins $Therapeutic Activity: 8-22 mins                    G Codes:      Derek MoundKellyn R Gurfateh Mcclain Jullianna Gabor, PTA Pager: [phone]   01/04/2017, 4:58 PM

## 2017-01-04 NOTE — Progress Notes (Signed)
Pt is on NIV at this time tolerating it well. Pt own nasal pillows 

## 2017-01-04 NOTE — Progress Notes (Signed)
Physical Therapy Treatment Patient Details Name: Cameron [name] MRN: [id number] DOB: Aug 21, 1946 Today's Date: 01/04/2017    History of Present Illness Pt admitted for L anterior THA. Pt with h/o kidney transplant in 2013.    PT Comments    Patient is progressing toward mobility and tolerated increased gait distance this session. Continue to progress as tolerated with anticipated d/c home with HHPT.   Follow Up Recommendations  Home health PT;Supervision/Assistance - 24 hour     Equipment Recommendations  Rolling walker with 5" wheels;3in1 (PT)    Recommendations for Other Services       Precautions / Restrictions Precautions Precautions: None Precaution Comments: direct anterior Restrictions Weight Bearing Restrictions: Yes LLE Weight Bearing: Weight bearing as tolerated    Mobility  Bed Mobility               General bed mobility comments: pt OOB in chair upon arrival  Transfers Overall transfer level: Needs assistance Equipment used: Rolling walker (2 wheeled) Transfers: Sit to/from Stand Sit to Stand: Min assist         General transfer comment: cues for hand placement; assist to power up into standing  Ambulation/Gait Ambulation/Gait assistance: Min guard Ambulation Distance (Feet): 75 Feet Assistive device: Rolling walker (2 wheeled) Gait Pattern/deviations: Decreased step length - right;Step-through pattern;Decreased stance time - left;Decreased weight shift to left;Antalgic;Trunk flexed Gait velocity: slow   General Gait Details: cues for sequencing and posture; pt with improved bilat step lengths and L hip flexion with increased distance   Stairs            Wheelchair Mobility    Modified Rankin (Stroke Patients Only)       Balance     Sitting balance-Leahy Scale: Fair       Standing balance-Leahy Scale: Poor                      Cognition Arousal/Alertness: Lethargic Behavior During Therapy: WFL for tasks  assessed/performed Overall Cognitive Status: Within Functional Limits for tasks assessed                 General Comments: Pt's wife in room for entire session.     Exercises Total Joint Exercises Heel Slides: AAROM;Left;10 reps Long Arc Quad: AROM;Left;10 reps    General Comments        Pertinent Vitals/Pain Pain Assessment: 0-10 Pain Score: 8  Pain Location: L hip Pain Descriptors / Indicators: Sore;Grimacing;Guarding Pain Intervention(s): Limited activity within patient's tolerance;Monitored during session;Premedicated before session;Repositioned;Ice applied    Home Living                      Prior Function            PT Goals (current goals can now be found in the care plan section) Acute Rehab PT Goals Patient Stated Goal: home Progress towards PT goals: Progressing toward goals    Frequency    7X/week      PT Plan Current plan remains appropriate    Co-evaluation             End of Session Equipment Utilized During Treatment: Gait belt Activity Tolerance: Patient tolerated treatment well Patient left: in chair;with call bell/phone within reach;with family/visitor present     Time: 1324-4010 PT Time Calculation (min) (ACUTE ONLY): 20 min  Charges:  $Gait Training: 8-22 mins  G Codes:      Derek MoundKellyn R Macrina Lehnert Verginia Toohey, PTA Pager: [phone]   01/04/2017, 1:30 PM

## 2017-01-04 NOTE — Progress Notes (Signed)
Placed patient on CPAP for the night with pressure set at 8cm  

## 2017-01-04 NOTE — Progress Notes (Signed)
Occupational Therapy Evaluation Patient Details Name: Cameron SessionsCardes H [name] MRN: [id number] DOB: 1946/04/29 Today's Date: 01/04/2017    History of Present Illness Pt admitted for L direct anterior THA. Pt with h/o kidney transplant in 2013.   Clinical Impression   PTA Pt independent in ADL and mobility. Pt full time pastor, driving etc. Pt currently max assist for ADL (LB specifically) and min guard for mobility with RW. Please see performance level below. Pt will benefit from skilled OT in the acute care setting to maximize independence and safety with ADL and to decrease caregiver burden/provide education for caregiver. Next session to focus on AE intro and energy conservation education.    Follow Up Recommendations  No OT follow up;Supervision - Intermittent    Equipment Recommendations  3 in 1 bedside commode    Recommendations for Other Services       Precautions / Restrictions Precautions Precautions: None Precaution Comments: direct anterior Restrictions Weight Bearing Restrictions: Yes LLE Weight Bearing: Weight bearing as tolerated      Mobility Bed Mobility Overal bed mobility: Needs Assistance Bed Mobility: Supine to Sit     Supine to sit: Min assist     General bed mobility comments: assist for L LE and trunk elevation, use of bed pad to assist with moving hips to EOB  Transfers Overall transfer level: Needs assistance Equipment used: Rolling walker (2 wheeled) Transfers: Sit to/from Stand Sit to Stand: Min assist         General transfer comment: v/c's for hand placement, assist for initial power up    Balance Overall balance assessment: Needs assistance Sitting-balance support: Single extremity supported;Feet supported;No upper extremity supported Sitting balance-Leahy Scale: Fair Sitting balance - Comments: sitting EOB with no back support, leaning to the right initially to take weight off operated hip   Standing balance support: Bilateral upper  extremity supported;During functional activity Standing balance-Leahy Scale: Poor Standing balance comment: reliant on RW                            ADL Overall ADL's : Needs assistance/impaired Eating/Feeding: Set up;Sitting;Modified independent   Grooming: Set up;Sitting;Wash/dry face;Oral care;With caregiver independent assisting Grooming Details (indicate cue type and reason): in recliner - cup method for oral care, wife assisting Upper Body Bathing: Supervision/ safety;Sitting   Lower Body Bathing: Moderate assistance;With caregiver independent assisting;Sit to/from stand   Upper Body Dressing : Set up;Sitting;With caregiver independent assisting   Lower Body Dressing: Moderate assistance;With caregiver independent assisting;Sit to/from stand   Toilet Transfer: Minimal assistance;Ambulation;BSC;RW (simulated with recliner - Pt declined at this time)       Tub/ Shower Transfer: Walk-in shower;Min guard;With caregiver independent assisting;Shower seat;Ambulation;Rolling walker Tub/Shower Transfer Details (indicate cue type and reason): Pt and wife educated on sequence and safety on entering walk in shower, also how to use 3 in 1 as shower chair Functional mobility during ADLs: Min guard;Rolling walker;Cueing for sequencing       Vision Vision Assessment?: No apparent visual deficits   Perception     Praxis      Pertinent Vitals/Pain Pain Assessment: 0-10 Pain Score: 3  Pain Location: L hip Pain Descriptors / Indicators: Sore Pain Intervention(s): Monitored during session;Repositioned;Ice applied     Hand Dominance Right   Extremity/Trunk Assessment Upper Extremity Assessment Upper Extremity Assessment: Overall WFL for tasks assessed   Lower Extremity Assessment Lower Extremity Assessment: LLE deficits/detail LLE Deficits / Details: decreased strength and ROM as  expected post-op   Cervical / Trunk Assessment Cervical / Trunk Assessment: Normal    Communication Communication Communication: No difficulties   Cognition Arousal/Alertness: Lethargic Behavior During Therapy: WFL for tasks assessed/performed Overall Cognitive Status: Within Functional Limits for tasks assessed                 General Comments: Pt's wife in room for entire session.    General Comments       Exercises       Shoulder Instructions      Home Living Family/patient expects to be discharged to:: Private residence Living Arrangements: Spouse/significant other Available Help at Discharge: Family;Available 24 hours/day Type of Home: House Home Access: Level entry     Home Layout: Two level Alternate Level Stairs-Number of Steps: 15 Alternate Level Stairs-Rails:  (has chair lift) Bathroom Shower/Tub: Producer, television/film/video: Standard Bathroom Accessibility: Yes How Accessible: Accessible via walker Home Equipment: Hand held shower head   Additional Comments: Pt is a Education officer, environmental, 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 3 to pulpit. Pt also has a very high bed to get into      Prior Functioning/Environment Level of Independence: Independent        Comments: pastor, driving, independent        OT Problem List: Decreased strength;Decreased range of motion;Decreased activity tolerance;Impaired balance (sitting and/or standing);Decreased knowledge of use of DME or AE;Pain   OT Treatment/Interventions: Self-care/ADL training;DME and/or AE instruction;Therapeutic activities;Balance training;Patient/family education    OT Goals(Current goals can be found in the care plan section) Acute Rehab OT Goals Patient Stated Goal: to get home OT Goal Formulation: With patient/family Time For Goal Achievement: 01/11/17 Potential to Achieve Goals: Good ADL Goals Pt Will Perform Lower Body Bathing: with min guard assist;with adaptive equipment;with caregiver independent in assisting;sitting/lateral leans Pt Will Perform Lower Body Dressing: with min guard  assist;with caregiver independent in assisting;with adaptive equipment;sit to/from stand Pt Will Transfer to Toilet: with supervision;ambulating (BSC over toilet) Pt Will Perform Toileting - Clothing Manipulation and hygiene: sit to/from stand;with min guard assist;with caregiver independent in assisting Pt Will Perform Tub/Shower Transfer: Shower transfer;with supervision;with caregiver independent in assisting;ambulating;3 in 1;rolling walker Additional ADL Goal #1: Pt will recall 3 ways to conserve energy during ADL  OT Frequency: Min 2X/week   Barriers to D/C:    none       Co-evaluation              End of Session Equipment Utilized During Treatment: Gait belt;Rolling walker Nurse Communication: Mobility status  Activity Tolerance: Patient tolerated treatment well Patient left: in chair;with call bell/phone within reach;with family/visitor present;Other (comment) (with PT in room about to begin session)   Time: [phone] OT Time Calculation (min): 41 min Charges:  OT General Charges $OT Visit: 1 Procedure OT Evaluation $OT Eval Moderate Complexity: 1 Procedure OT Treatments $Self Care/Home Management : 23-37 mins G-Codes:    Evern Bio Antonella Upson Jan 24, 2017, 10:36 AM Sherryl Manges OTR/L [phone]

## 2017-01-04 NOTE — Progress Notes (Signed)
Subjective: 1 Day Post-Op Procedure(s) (LRB): TOTAL HIP ARTHROPLASTY ANTERIOR APPROACH (Left)   Patinet has a low grade fever this morning. He finished his UTI abx yesterday which was a 7 day course of cipro recommended by his nephrologist. He otherwise feels well and is not having any urinary symptoms at this time.   Activity level:  wbat Diet tolerance:  ok Voiding:  ok Patient reports pain as mild.    Objective: Vital signs in last 24 hours: Temp:  [97 F (36.1 C)-101.9 F (38.8 C)] 101.9 F (38.8 C) (01/10 16100642) Pulse Rate:  [55-84] 84 (01/10 0642) Resp:  [12-17] 16 (01/10 0642) BP: (111-176)/(48-70) 176/66 (01/10 0642) SpO2:  [93 %-100 %] 97 % (01/10 0642)  Labs: No results for input(s): HGB in the last 72 hours. No results for input(s): WBC, RBC, HCT, PLT in the last 72 hours. No results for input(s): NA, K, CL, CO2, BUN, CREATININE, GLUCOSE, CALCIUM in the last 72 hours. No results for input(s): LABPT, INR in the last 72 hours.  Physical Exam:  Neurologically intact ABD soft Neurovascular intact Sensation intact distally Intact pulses distally Dorsiflexion/Plantar flexion intact Incision: dressing C/D/I and no drainage No cellulitis present Compartment soft  Assessment/Plan:  1 Day Post-Op Procedure(s) (LRB): TOTAL HIP ARTHROPLASTY ANTERIOR APPROACH (Left) Advance diet Up with therapy D/C IV fluids Plan for discharge tomorrow Discharge home with home health if doing well and cleared by PT. Continue on ASA 325mg  BID x 2 weeks post op. If he still continues with a fever then we will get a new UA with culture and also a CBC.  Follow up in office 2 weeks post op  Kadir Azucena, Ginger OrganNDREW PAUL 01/04/2017, 7:58 AM

## 2017-01-05 ENCOUNTER — Encounter (HOSPITAL_COMMUNITY): Payer: Self-pay

## 2017-01-05 LAB — GLUCOSE, CAPILLARY
GLUCOSE-CAPILLARY: 80 mg/dL (ref 65–99)
GLUCOSE-CAPILLARY: 189 mg/dL — AB (ref 65–99)
GLUCOSE-CAPILLARY: 136 mg/dL — AB (ref 65–99)
Glucose-Capillary: 151 mg/dL — ABNORMAL HIGH (ref 65–99)

## 2017-01-05 LAB — URINE CULTURE: Culture: <10000 — AB

## 2017-01-05 NOTE — Progress Notes (Signed)
Occupational Therapy Treatment Patient Details Name: Cameron [name] MRN: [id number] DOB: Jul 09, 1946 Today's Date: 01/05/2017    History of present illness Pt admitted for L anterior THA. Pt with h/o kidney transplant in 2013.   OT comments  Pt continues to make progress towards OT goals this session. Pt provided with energy conservation handout and reviewed with Pt and wife. Pt and wife also educated on AE, and practiced for LB bathing/dressing. Please see performance level below. Pt at adequate level to dc to venue below from OT perspective. Pt and wife with no further questions for OT at end of session.   Follow Up Recommendations  No OT follow up;Supervision - Intermittent    Equipment Recommendations  3 in 1 bedside commode    Recommendations for Other Services      Precautions / Restrictions Precautions Precautions: None Precaution Comments: direct anterior Restrictions Weight Bearing Restrictions: Yes LLE Weight Bearing: Weight bearing as tolerated       Mobility Bed Mobility Overal bed mobility: Needs Assistance Bed Mobility: Supine to Sit;Sit to Supine     Supine to sit: Min assist Sit to supine: Mod assist   General bed mobility comments: pt used AE (leg lifter) to bring L LE to EOB and lower down and required assistance to elevate trunk into sitting and to bring bilat LE into bed; cues for sequencing and technique; bed elevated to simulate bed at home  Transfers Overall transfer level: Needs assistance Equipment used: Rolling walker (2 wheeled) Transfers: Sit to/from Stand Sit to Stand: Min guard         General transfer comment: min guard for safety    Balance Overall balance assessment: Needs assistance Sitting-balance support: Single extremity supported;Feet supported Sitting balance-Leahy Scale: Fair     Standing balance support: Bilateral upper extremity supported;During functional activity Standing balance-Leahy Scale: Poor Standing balance  comment: reliant on RW                   ADL Overall ADL's : Needs assistance/impaired     Grooming: Wash/dry face;Set up;Sitting       Lower Body Bathing: Supervison/ safety;With caregiver independent assisting;With adaptive equipment;Sitting/lateral leans Lower Body Bathing Details (indicate cue type and reason): Pt educated in use of long handle sponge Upper Body Dressing : Set up;Sitting;With caregiver independent assisting   Lower Body Dressing: Supervision/safety;With caregiver independent assisting;With adaptive equipment;Sit to/from stand Lower Body Dressing Details (indicate cue type and reason): Pt educated in use of grabber/reacher, sock donner, and long handle shoe horn             Functional mobility during ADLs: Min guard;Rolling walker;Cueing for sequencing General ADL Comments: Pt limited by pain this session, but continues to make progress, be pleasant, and show lots of effort      Vision                     Perception     Praxis      Cognition   Behavior During Therapy: [facility] for tasks assessed/performed Overall Cognitive Status: Within Functional Limits for tasks assessed                       Extremity/Trunk Assessment               Exercises Total Joint Exercises Hip ABduction/ADduction: AROM;Left;10 reps;Standing Knee Flexion: AROM;Left;10 reps;Standing Marching in Standing: AROM;Left;10 reps;Standing Standing Hip Extension: AROM;Left;10 reps;Standing   Shoulder Instructions  General Comments      Pertinent Vitals/ Pain       Pain Assessment: 0-10 Pain Score: 5  Pain Location: L hip  Pain Descriptors / Indicators: Sore;Grimacing;Guarding Pain Intervention(s): Limited activity within patient's tolerance;Monitored during session;Premedicated before session;Repositioned;Ice applied  Home Living                                          Prior Functioning/Environment               Frequency  Min 2X/week        Progress Toward Goals  OT Goals(current goals can now be found in the care plan section)  Progress towards OT goals: Progressing toward goals  Acute Rehab OT Goals Patient Stated Goal: to get home OT Goal Formulation: With patient/family Time For Goal Achievement: 01/12/17 Potential to Achieve Goals: Good  Plan Discharge plan remains appropriate    Co-evaluation    PT/OT/SLP Co-Evaluation/Treatment: Yes Reason for Co-Treatment: For patient/therapist safety;Other (comment) (Pt tolerance) PT goals addressed during session: Mobility/safety with mobility OT goals addressed during session: ADL's and self-care;Proper use of Adaptive equipment and DME      End of Session Equipment Utilized During Treatment: Gait belt;Rolling walker   Activity Tolerance Patient tolerated treatment well   Patient Left in bed;with call bell/phone within reach;with family/visitor present   Nurse Communication Mobility status        Time: [id number] OT Time Calculation (min): 33 min  Charges: OT General Charges $OT Visit: 1 Procedure OT Treatments $Self Care/Home Management : 8-22 mins  Evern BioLaura J Sherlonda Flater 01/05/2017, 4:54 PM Sherryl MangesLaura Pyper Olexa OTR/L [phone]

## 2017-01-05 NOTE — Progress Notes (Signed)
Physical Therapy Treatment Patient Details Name: Cameron SessionsCardes H [name] MRN: [id number] DOB: 1946/07/05 Today's Date: 01/05/2017    History of Present Illness Pt admitted for L anterior THA. Pt with h/o kidney transplant in 2013.    PT Comments    Patient continues to make progress with mobility and tolerated L LE therex well this session. Continue to progress as tolerated with anticipated d/c home with HHPT.   Follow Up Recommendations  Home health PT;Supervision/Assistance - 24 hour     Equipment Recommendations  Rolling walker with 5" wheels;3in1 (PT)    Recommendations for Other Services       Precautions / Restrictions Precautions Precautions: None Precaution Comments: direct anterior Restrictions Weight Bearing Restrictions: Yes LLE Weight Bearing: Weight bearing as tolerated    Mobility  Bed Mobility Overal bed mobility: Needs Assistance Bed Mobility: Supine to Sit;Sit to Supine     Supine to sit: Min assist Sit to supine: Mod assist   General bed mobility comments: pt used AD to bring L LE to EOB and lower down and required assistance to elevate trunk into sitting and to bring bilat LE into bed; cues for sequencing and technique; bed elevated to simulate bed at home  Transfers Overall transfer level: Needs assistance Equipment used: Rolling walker (2 wheeled) Transfers: Sit to/from Stand Sit to Stand: Min guard         General transfer comment: min guard for safety  Ambulation/Gait Ambulation/Gait assistance: Min guard Ambulation Distance (Feet): 8 Feet Assistive device: Rolling walker (2 wheeled) Gait Pattern/deviations: Step-through pattern;Decreased weight shift to left;Antalgic;Trunk flexed;Decreased stride length Gait velocity: decreased   General Gait Details: mildly antalgic gait   Stairs Stairs: Yes   Stair Management: One rail Left;Forwards;Sideways Number of Stairs: 2 General stair comments: cues for sequencing; assist to steady when  ascending  Wheelchair Mobility    Modified Rankin (Stroke Patients Only)       Balance     Sitting balance-Leahy Scale: Fair       Standing balance-Leahy Scale: Poor                      Cognition Arousal/Alertness: Awake/alert Behavior During Therapy: WFL for tasks assessed/performed Overall Cognitive Status: Within Functional Limits for tasks assessed                      Exercises Total Joint Exercises Hip ABduction/ADduction: AROM;Left;10 reps;Standing Knee Flexion: AROM;Left;10 reps;Standing Marching in Standing: AROM;Left;10 reps;Standing Standing Hip Extension: AROM;Left;10 reps;Standing    General Comments General comments (skin integrity, edema, etc.): wife present and actively participating in session      Pertinent Vitals/Pain Pain Assessment: 0-10 Pain Score: 5  Faces Pain Scale: Hurts little more Pain Location: L hip  Pain Descriptors / Indicators: Sore;Grimacing;Guarding Pain Intervention(s): Limited activity within patient's tolerance;Monitored during session;Premedicated before session;Repositioned;Ice applied    Home Living                      Prior Function            PT Goals (current goals can now be found in the care plan section) Acute Rehab PT Goals Patient Stated Goal: home Progress towards PT goals: Progressing toward goals    Frequency    7X/week      PT Plan Current plan remains appropriate    Co-evaluation PT/OT/SLP Co-Evaluation/Treatment: Yes Reason for Co-Treatment: For patient/therapist safety;Other (comment) (pt tolerance) PT goals addressed during session: Mobility/safety with  mobility;Strengthening/ROM       End of Session Equipment Utilized During Treatment: Gait belt Activity Tolerance: Patient tolerated treatment well Patient left: with call bell/phone within reach;with family/visitor present;in bed     Time: 1610-9604 PT Time Calculation (min) (ACUTE ONLY): 33  min  Charges:   $Therapeutic Exercise: 8-22 mins                     G Codes:      Derek Mound, PTA Pager: [phone]   01/05/2017, 3:45 PM

## 2017-01-05 NOTE — Progress Notes (Signed)
Subjective: 2 Days Post-Op Procedure(s) (LRB): TOTAL HIP ARTHROPLASTY ANTERIOR APPROACH (Left)  Activity level:  wbat Diet tolerance:  ok Voiding:  ok Patient reports pain as mild.    Objective: Vital signs in last 24 hours: Temp:  [99.1 F (37.3 C)-100.4 F (38 C)] 99.1 F (37.3 C) (01/11 0923) Pulse Rate:  [77-83] 83 (01/11 0624) Resp:  [16-18] 18 (01/10 2129) BP: (160-171)/(54-60) 160/60 (01/11 0624) SpO2:  [97 %-99 %] 97 % (01/11 0624) FiO2 (%):  [8 %] 8 % (01/10 2129)  Labs: No results for input(s): HGB in the last 72 hours. No results for input(s): WBC, RBC, HCT, PLT in the last 72 hours. No results for input(s): NA, K, CL, CO2, BUN, CREATININE, GLUCOSE, CALCIUM in the last 72 hours. No results for input(s): LABPT, INR in the last 72 hours.  Physical Exam:  Neurologically intact ABD soft Neurovascular intact Sensation intact distally Intact pulses distally Dorsiflexion/Plantar flexion intact Incision: dressing C/D/I and no drainage No cellulitis present Compartment soft  Assessment/Plan:  2 Days Post-Op Procedure(s) (LRB): TOTAL HIP ARTHROPLASTY ANTERIOR APPROACH (Left) Advance diet Up with therapy Plan for discharge tomorrow Discharge home with home health if cleared by PT and still doing well. Continue on ASA 325mg  BID x 4 weeks post op. Follow up in office 2 weeks post op. Culture of UA shows insignificant growth. We will finish up a course or Macrobid to make sure all bacteria clears with his history of kidney transplant.   Gearldene Fiorenza, Ginger OrganNDREW PAUL 01/05/2017, 12:42 PM

## 2017-01-05 NOTE — Progress Notes (Addendum)
Physical Therapy Treatment Patient Details Name: Cameron [name] MRN: [id number] DOB: May 19, 1946 Today's Date: 01/05/2017    History of Present Illness Pt admitted for L anterior THA. Pt with h/o kidney transplant in 2013.    PT Comments    Patient reported feeling better today than yesterday and is making progress toward mobility goals. Pt tolerated increased gait distance and with improved gait mechanics. Pt has the most difficulty with bed mobility. HEP next session. Continue to progress as tolerated with anticipated d/c to SNF for further skilled PT services.    Follow Up Recommendations  Home health PT;Supervision/Assistance - 24 hour     Equipment Recommendations  Rolling walker with 5" wheels;3in1 (PT)    Recommendations for Other Services       Precautions / Restrictions Precautions Precautions: None Precaution Comments: direct anterior Restrictions Weight Bearing Restrictions: Yes LLE Weight Bearing: Weight bearing as tolerated    Mobility  Bed Mobility Overal bed mobility: Needs Assistance Bed Mobility: Supine to Sit       Sit to supine: Mod assist   General bed mobility comments: pt able to bring bilat LE to EOB and required assist to elevate trunk into sitting; increased time and effort; cues for sequencing and technique  Transfers Overall transfer level: Needs assistance Equipment used: Rolling walker (2 wheeled) Transfers: Sit to/from Stand Sit to Stand: Min guard         General transfer comment: min guard for safety  Ambulation/Gait Ambulation/Gait assistance: Min guard Ambulation Distance (Feet): 200 Feet Assistive device: Rolling walker (2 wheeled) Gait Pattern/deviations: Step-through pattern;Decreased weight shift to left;Antalgic;Trunk flexed;Decreased stride length Gait velocity: decreased   General Gait Details: mildly antalgic gait; slow and steady; heavy reliance on RW and required brief standing rest breaks due to bilat UE  fatigue   Stairs Stairs: Yes   Stair Management: One rail Left;Forwards;Sideways Number of Stairs: 2 General stair comments: cues for sequencing; assist to steady when ascending  Wheelchair Mobility    Modified Rankin (Stroke Patients Only)       Balance     Sitting balance-Leahy Scale: Fair       Standing balance-Leahy Scale: Poor                      Cognition Arousal/Alertness: Awake/alert Behavior During Therapy: WFL for tasks assessed/performed Overall Cognitive Status: Within Functional Limits for tasks assessed                      Exercises      General Comments        Pertinent Vitals/Pain Pain Assessment: Faces Faces Pain Scale: Hurts little more Pain Location: L hip and LE cramps Pain Descriptors / Indicators: Sore;Grimacing;Guarding;Cramping Pain Intervention(s): Limited activity within patient's tolerance;Monitored during session;Premedicated before session;Repositioned    Home Living                      Prior Function            PT Goals (current goals can now be found in the care plan section) Acute Rehab PT Goals Patient Stated Goal: home Progress towards PT goals: Progressing toward goals    Frequency    7X/week      PT Plan Current plan remains appropriate    Co-evaluation             End of Session Equipment Utilized During Treatment: Gait belt Activity Tolerance: Patient tolerated treatment well Patient  left: with call bell/phone within reach;with family/visitor present;in chair     Time: 1131-1208 PT Time Calculation (min) (ACUTE ONLY): 37 min  Charges:  $Gait Training: 8-22 mins $Therapeutic Activity: 8-22 mins                    G Codes:      Derek MoundKellyn R Jayvon Mounger Assata Juncaj, PTA Pager: [phone]   01/05/2017, 12:17 PM

## 2017-01-05 NOTE — Progress Notes (Signed)
Placed patient on CPAP set at 8cm for the night.  

## 2017-01-05 NOTE — Care Management Note (Signed)
Case Management Note  Patient Details  Name: Cameron [name] MRN: [id number] Date of Birth: April 21, 1946  Subjective/Objective:  71 yr old gentleman s/p left total hip arthroplasty, anterior approach.                  Action/Plan: Case manager spoke with patient and wife concerning Home Health and DME needs. Choice was offered for Home Health agency. Referral was called to Shaune LeeksJermaine Jenkins, [facility] Liaison. RW, 3in1 have been  Delivered to patient's home. He will have family support at discharge.    Expected Discharge Date:   01/06/17               Expected Discharge Plan:  Home w Home Health Services  In-House Referral:  NA  Discharge planning Services  CM Consult  Post Acute Care Choice:  Home Health Choice offered to:  Patient, Spouse  DME Arranged:  3-N-1, Walker rolling DME Agency:   [facility]  HH Arranged:  PT HH Agency:  [facility]  Status of Service:  Completed, signed off  If discussed at Long Length of Stay Meetings, dates discussed:    Additional Comments:  Durenda GuthrieBrady, Brentney Goldbach Naomi, RN 01/05/2017, 3:17 PM

## 2017-01-06 LAB — GLUCOSE, CAPILLARY: Glucose-Capillary: 143 mg/dL — ABNORMAL HIGH (ref 65–99)

## 2017-01-06 MED ORDER — HYDROCODONE-ACETAMINOPHEN 5-325 MG PO TABS: 1.0000 | ORAL_TABLET | ORAL | 0 refills | Status: DC | PRN

## 2017-01-06 MED ORDER — NITROFURANTOIN MONOHYD MACRO 100 MG PO CAPS: 100.0000 mg | ORAL_CAPSULE | Freq: Two times a day (BID) | ORAL | 0 refills | Status: DC

## 2017-01-06 MED ORDER — ASPIRIN 325 MG PO TBEC: 325.0000 mg | DELAYED_RELEASE_TABLET | Freq: Two times a day (BID) | ORAL | 0 refills | Status: AC

## 2017-01-06 MED ORDER — METHOCARBAMOL 500 MG PO TABS: 500.0000 mg | ORAL_TABLET | Freq: Four times a day (QID) | ORAL | 0 refills | Status: DC | PRN
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
## 2017-01-06 NOTE — Progress Notes (Signed)
Physical Therapy Treatment Patient Details Name: Cameron [name] MRN: [id number] DOB: December 12, 1946 Today's Date: 01/06/2017    History of Present Illness Pt admitted for L anterior THA. Pt with h/o kidney transplant in 2013.    PT Comments    Patient is making good progress with PT.  From a mobility standpoint anticipate patient will be ready for DC home when medically ready.     Follow Up Recommendations  Home health PT;Supervision/Assistance - 24 hour     Equipment Recommendations  Rolling walker with 5" wheels;3in1 (PT)    Recommendations for Other Services       Precautions / Restrictions Precautions Precautions: None Precaution Comments: direct anterior Restrictions Weight Bearing Restrictions: Yes LLE Weight Bearing: Weight bearing as tolerated    Mobility  Bed Mobility Overal bed mobility: Needs Assistance Bed Mobility: Supine to Sit     Supine to sit: Min assist     General bed mobility comments: assist to bring L LE to EOB  Transfers Overall transfer level: Needs assistance Equipment used: Rolling walker (2 wheeled) Transfers: Sit to/from Stand Sit to Stand: Min guard         General transfer comment: min guard for safety  Ambulation/Gait Ambulation/Gait assistance: Min guard Ambulation Distance (Feet): 175 Feet Assistive device: Rolling walker (2 wheeled) Gait Pattern/deviations: Step-through pattern;Antalgic;Decreased stride length Gait velocity: decreased   General Gait Details: mildly antalgic gait but steady; cues for posture   Stairs            Wheelchair Mobility    Modified Rankin (Stroke Patients Only)       Balance     Sitting balance-Leahy Scale: Good       Standing balance-Leahy Scale: Poor                      Cognition Arousal/Alertness: Awake/alert Behavior During Therapy: WFL for tasks assessed/performed Overall Cognitive Status: Within Functional Limits for tasks assessed                       Exercises Total Joint Exercises Quad Sets: AROM;Both;10 reps Heel Slides: AROM;Left;10 reps Hip ABduction/ADduction: AROM;Left;10 reps Long Arc Quad: AROM;Left;10 reps    General Comments General comments (skin integrity, edema, etc.): HEP handout given and reviewed with pt and wife      Pertinent Vitals/Pain Pain Assessment: Faces Faces Pain Scale: Hurts little more Pain Location: L hip  Pain Descriptors / Indicators: Sore;Grimacing;Guarding Pain Intervention(s): Limited activity within patient's tolerance;Monitored during session;Premedicated before session;Repositioned    Home Living                      Prior Function            PT Goals (current goals can now be found in the care plan section) Acute Rehab PT Goals Patient Stated Goal: home Progress towards PT goals: Progressing toward goals    Frequency    7X/week      PT Plan Current plan remains appropriate    Co-evaluation             End of Session Equipment Utilized During Treatment: Gait belt Activity Tolerance: Patient tolerated treatment well Patient left: with call bell/phone within reach;with family/visitor present;in bed (sitting EOB with wife)     Time: 4098-1191 PT Time Calculation (min) (ACUTE ONLY): 28 min  Charges:  $Gait Training: 8-22 mins $Therapeutic Exercise: 8-22 mins  G Codes:      Derek MoundKellyn R Sherryann Frese Melenie Minniear, PTA Pager: [phone]   01/06/2017, 9:25 AM

## 2017-01-06 NOTE — Care Management Important Message (Signed)
Important Message  Patient Details  Name: Cameron [name] MRN: [id number] Date of Birth: 10-14-1946   Medicare Important Message Given:  Yes    Iva Posten Stefan ChurchBratton 01/06/2017, 12:40 PM

## 2017-10-10 IMAGING — RF DG C-ARM 61-120 MIN
1 series · 3 of 3 positions shown · non-contrast
Comparison: No recent prior.

CLINICAL DATA: Left hip surgery.

EXAM:
OPERATIVE left HIP (WITH PELVIS IF PERFORMED) 3 VIEWS
TECHNIQUE: Fluoroscopic spot image(s) were submitted for interpretation
post-operatively.

[Series 1: run · 3 of 3 slices shown]
[im 1/3]
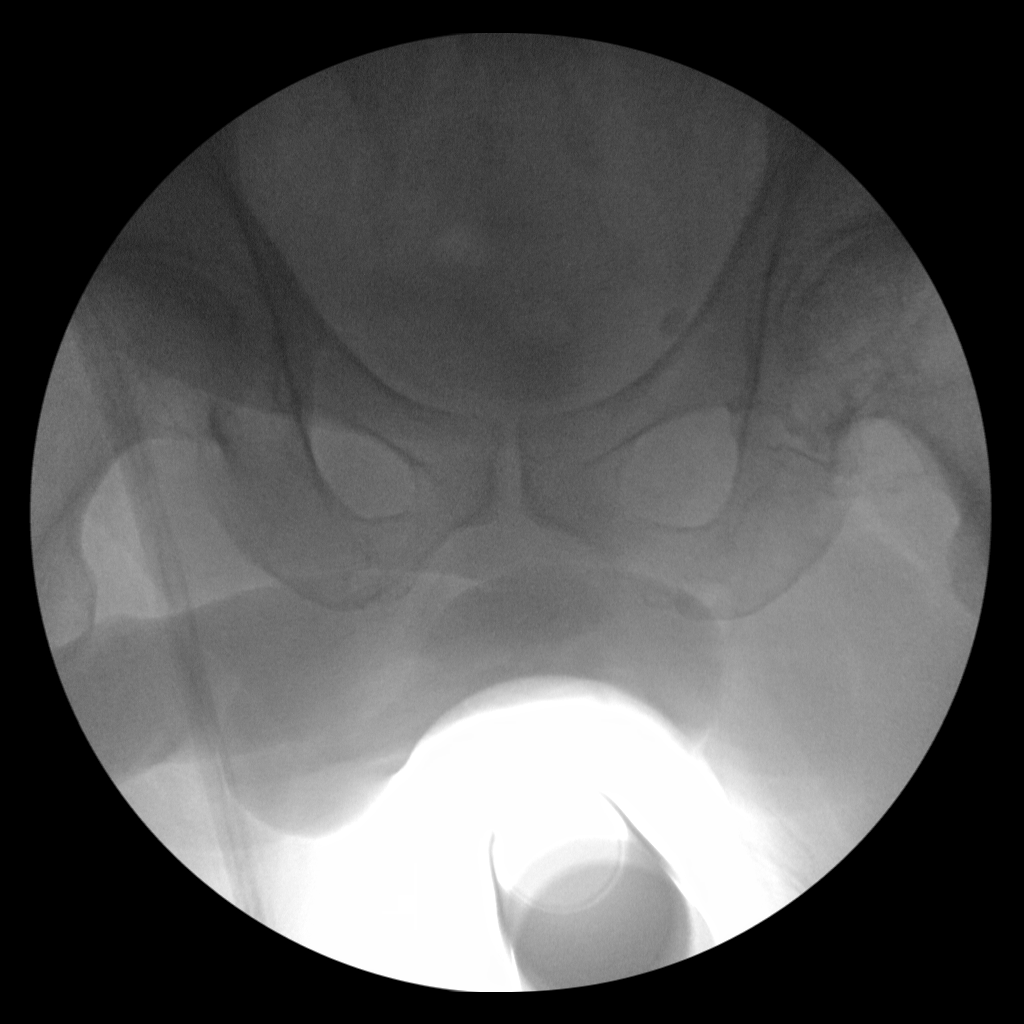
[im 2/3]
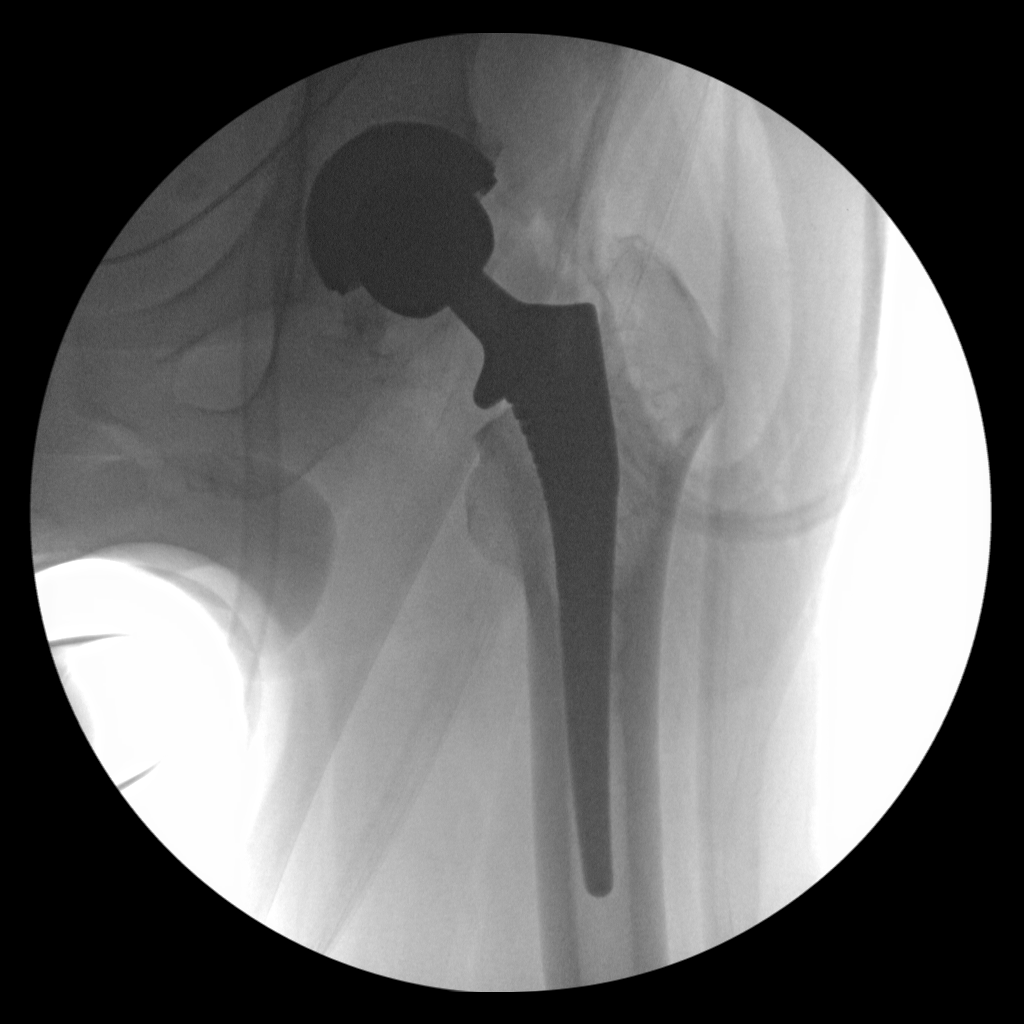
[im 3/3]
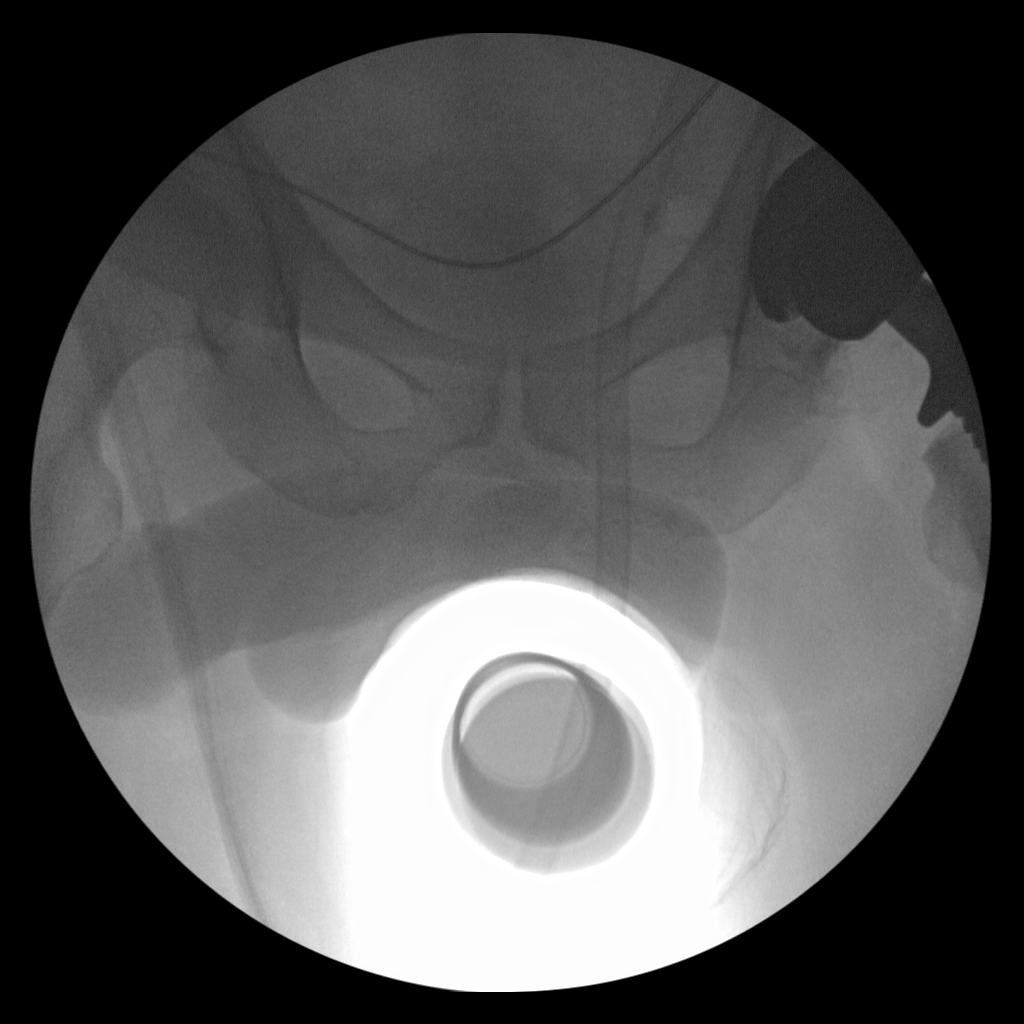

[3 of 3 positions shown; findings below may reference images not displayed]

FINDINGS: Total left hip replacement.  Hardware intact.  Anatomic alignment.
IMPRESSION: Total left hip replacement with anatomic alignment.

## 2018-04-11 ENCOUNTER — Ambulatory Visit (INDEPENDENT_AMBULATORY_CARE_PROVIDER_SITE_OTHER): Payer: Medicare Other

## 2018-04-11 ENCOUNTER — Ambulatory Visit: Payer: Medicare Other | Admitting: Podiatry

## 2018-04-11 ENCOUNTER — Encounter: Payer: Self-pay | Admitting: Podiatry

## 2018-04-11 VITALS — BP 191/71 | HR 67 | Resp 16

## 2018-04-11 DIAGNOSIS — M722 Plantar fascial fibromatosis: Secondary | ICD-10-CM

## 2018-04-11 MED ORDER — TRIAMCINOLONE ACETONIDE 10 MG/ML IJ SUSP
10.0000 mg | Freq: Once | INTRAMUSCULAR | Status: AC
  Administered 2018-04-11: 10 mg

## 2018-04-11 NOTE — Patient Instructions (Signed)

## 2018-04-15 NOTE — Progress Notes (Signed)
Subjective:   Patient ID: Cameron [name], male   DOB: 72 y.o.   MRN: [id number]   HPI Patient presents with severe discomfort plantar aspect left heel times 2 weeks.  Patient states that it has gotten worse over time and that it makes it hard for him to be ambulatory and he does not smoke and likes to be active   Review of Systems  All other systems reviewed and are negative.       Objective:  Physical Exam  Constitutional: He appears well-developed and well-nourished.  Cardiovascular: Intact distal pulses.  Pulmonary/Chest: Effort normal.  Musculoskeletal: Normal range of motion.  Neurological: He is alert.  Skin: Skin is warm.  Nursing note and vitals reviewed.   Neurovascular status intact muscle strength is adequate range of motion within normal limits with exquisite discomfort plantar aspect left heel at the insertional point of the tendon into the calcaneus with fluid buildup around the medial band and pain with palpation.  Good digital perfusion well oriented x3    Assessment:  Acute plantar fasciitis left heel     Plan:  H&P x-ray and condition reviewed and today I injected the plantar fascial left 3 mg Kenalog 5 mg Xylocaine applied fascial brace with instructions on usage and advised him supportive shoes  X-rays indicate that there is a spur formation with no indication of stress fracture arthritis

## 2018-04-20 ENCOUNTER — Ambulatory Visit: Payer: Medicare Other | Admitting: Podiatry

## 2018-04-26 ENCOUNTER — Ambulatory Visit: Payer: Medicare Other | Admitting: Podiatry

## 2018-04-30 ENCOUNTER — Encounter: Payer: Self-pay | Admitting: Podiatry

## 2018-04-30 ENCOUNTER — Ambulatory Visit: Payer: Medicare Other | Admitting: Podiatry

## 2018-04-30 DIAGNOSIS — M722 Plantar fascial fibromatosis: Secondary | ICD-10-CM

## 2018-04-30 MED ORDER — TRIAMCINOLONE ACETONIDE 10 MG/ML IJ SUSP
10.0000 mg | Freq: Once | INTRAMUSCULAR | Status: AC
  Administered 2018-04-30: 10 mg

## 2018-05-02 NOTE — Progress Notes (Signed)
Subjective:   Patient ID: Cameron [name], male   DOB: 71 y.o.   MRN: [id number]   HPI Patient presents stating the heel is feeling quite a bit better but concerned about the brace and still mild to moderate discomfort at times   ROS      Objective:  Physical Exam  Neurovascular status found to be intact muscle strength is adequate patient's left heel medial side doing very well with pain in the center in the lateral band upon deep palpation     Assessment:  Plantar fasciitis present of the central and lateral band of the heel     Plan:  H&P condition reviewed and did careful lateral injection of the fascia 3 mg Kenalog 5 mg Xylocaine advised on supportive shoes and reappoint to recheck as symptoms indicate
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
## 2018-12-11 ENCOUNTER — Encounter: Payer: Self-pay | Admitting: Endocrinology

## 2018-12-11 ENCOUNTER — Ambulatory Visit (INDEPENDENT_AMBULATORY_CARE_PROVIDER_SITE_OTHER): Payer: Medicare Other | Admitting: Endocrinology

## 2018-12-11 VITALS — BP 142/80 | HR 66 | Ht 71.0 in | Wt 208.8 lb

## 2018-12-11 DIAGNOSIS — E1165 Type 2 diabetes mellitus with hyperglycemia: Secondary | ICD-10-CM | POA: Diagnosis not present

## 2018-12-11 DIAGNOSIS — Z794 Long term (current) use of insulin: Secondary | ICD-10-CM | POA: Diagnosis not present

## 2018-12-11 DIAGNOSIS — I10 Essential (primary) hypertension: Secondary | ICD-10-CM

## 2018-12-11 DIAGNOSIS — E7800 Pure hypercholesterolemia, unspecified: Secondary | ICD-10-CM | POA: Diagnosis not present

## 2018-12-11 LAB — GLUCOSE, POCT (MANUAL RESULT ENTRY): POC GLUCOSE: 174 mg/dL — AB (ref 70–99)

## 2018-12-11 MED ORDER — METFORMIN HCL ER 500 MG PO TB24: 1500.0000 mg | ORAL_TABLET | Freq: Every day | ORAL | 3 refills | Status: DC

## 2018-12-11 NOTE — Patient Instructions (Signed)
Take Lantus 12 hrs apart  Check blood sugars on waking up 7 days a week  Also check blood sugars about 2 hours after meals and do this after different meals by rotation  Recommended blood sugar levels on waking up are 90-130 and about 2 hours after meal is 130-160  Please bring your blood sugar monitor to each visit, thank you  Start taking Metformin 500 mg, 1 tablet with your main meal for 5 days. Occasionally this may initially cause loose stools or nausea. If  tolerating well after 5 days add a second Metformin tablet (500 mg) at the same time. Continue adding another tablet after 5 days days if no persistent nausea or diarrhea until reaching the maximum tolerated dose or the full dose of 3 tablets once daily.

## 2019-01-15 ENCOUNTER — Other Ambulatory Visit: Payer: Medicare Other

## 2019-01-16 ENCOUNTER — Other Ambulatory Visit (INDEPENDENT_AMBULATORY_CARE_PROVIDER_SITE_OTHER): Payer: Medicare Other

## 2019-01-16 DIAGNOSIS — Z794 Long term (current) use of insulin: Secondary | ICD-10-CM

## 2019-01-16 DIAGNOSIS — E1165 Type 2 diabetes mellitus with hyperglycemia: Secondary | ICD-10-CM

## 2019-01-16 LAB — BASIC METABOLIC PANEL
BUN: 24 mg/dL — AB (ref 6–23)
CHLORIDE: 102 meq/L (ref 96–112)
CO2: 28 meq/L (ref 19–32)
Calcium: 9.8 mg/dL (ref 8.4–10.5)
Creatinine, Ser: 1.4 mg/dL (ref 0.40–1.50)
GFR: 59.83 mL/min — ABNORMAL LOW (ref >60.00)
Glucose, Bld: 107 mg/dL — ABNORMAL HIGH (ref 70–99)
POTASSIUM: 3.2 meq/L — AB (ref 3.5–5.1)
Sodium: 138 mEq/L (ref 135–145)

## 2019-01-16 LAB — MICROALBUMIN / CREATININE URINE RATIO
Creatinine,U: 333 mg/dL
Microalb Creat Ratio: 19.7 mg/g (ref 0.0–30.0)
Microalb, Ur: 65.6 mg/dL — ABNORMAL HIGH (ref 0.0–1.9)

## 2019-01-17 LAB — FRUCTOSAMINE: Fructosamine: 413 umol/L — ABNORMAL HIGH (ref 0–285)

## 2019-01-18 ENCOUNTER — Ambulatory Visit (INDEPENDENT_AMBULATORY_CARE_PROVIDER_SITE_OTHER): Payer: Medicare Other | Admitting: Endocrinology

## 2019-01-18 ENCOUNTER — Encounter: Payer: Self-pay | Admitting: Endocrinology

## 2019-01-18 VITALS — BP 190/70 | HR 73 | Ht 71.0 in | Wt 205.2 lb

## 2019-01-18 DIAGNOSIS — Z794 Long term (current) use of insulin: Secondary | ICD-10-CM | POA: Diagnosis not present

## 2019-01-18 DIAGNOSIS — I10 Essential (primary) hypertension: Secondary | ICD-10-CM

## 2019-01-18 DIAGNOSIS — E1165 Type 2 diabetes mellitus with hyperglycemia: Secondary | ICD-10-CM | POA: Diagnosis not present

## 2019-01-18 LAB — POCT GLYCOSYLATED HEMOGLOBIN (HGB A1C): Hemoglobin A1C: 8.8 % — AB (ref 4.0–5.6)
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
## 2019-01-18 NOTE — Patient Instructions (Signed)
Check blood sugars on waking up 7 days a week and before meals  Also check blood sugars about 2 hours after meals and do this after different meals by rotation  Recommended blood sugar levels on waking up are 90-130 and about 2 hours after meal is 130-160  Please bring your blood sugar monitor to each visit, thank you  Lantus 20 in am and 10 in pm regardless of sugars  Metformin 3 daily  Humalog 5 with low carb meal, 10 if doing 1 starch and 15 with large meal, fast food or 2 starches

## 2019-01-29 ENCOUNTER — Institutional Professional Consult (permissible substitution): Payer: Medicare Other | Admitting: Pulmonary Disease

## 2019-01-30 ENCOUNTER — Encounter: Payer: Medicare Other | Attending: Endocrinology | Admitting: Nutrition

## 2019-01-30 DIAGNOSIS — E119 Type 2 diabetes mellitus without complications: Secondary | ICD-10-CM

## 2019-01-30 DIAGNOSIS — Z794 Long term (current) use of insulin: Secondary | ICD-10-CM | POA: Diagnosis present

## 2019-01-31 NOTE — Progress Notes (Signed)
Mr. Hyke is here with his wife to review blood sugar readings/insulin doses.  Insulin dose:  Lantus 20u BID, taking this at 9AM and 7PM.  R insulin is used and he admits that he does not always take it 3X/day, and sometimes eats only 2 meals per day with snacking some between meals.  Discussed importance if taking R insulin for snacking, if not meal time insulin is used previously.  Stress the need for at least 5 units, if this snack is a substitute for his meal.    His wife prepares most meals for him,  Breakfast is 1-2 eggs, cheese, sausage and coffee with 11 packets of splenda  Her reports that he rarely eats lunch--maybe only 2 days/wk.  Supper is his big meal, and according to his wife, he eats meat, 3 veg.--one is starchy, but only 1/2 cup.  Says deserts are rare,--1-2 times/wk.  Wife believes he is eating at work, that he does not tell her, and he did not deny this, saying he needs to change his eating habits.   We discussed the idea of balanced meals, and the need to limit protein due to kidney transplant.  He will eat only 3 ounces at breakfast, 3-4 at lunch and no more than 4 ounces with supper meal.  Stressed the need to see a dietitian for more detailed diet information Discussed goals for blood sugar readings, both before meals and 2 hours after meals.  Strongly encouraged exercise--to work up to 30 min. 3-4 X/wk, if okay with MD.  They reported good understanding and had no final questions.

## 2019-02-01 ENCOUNTER — Telehealth: Payer: Self-pay | Admitting: Pulmonary Disease

## 2019-02-01 NOTE — Telephone Encounter (Signed)
Called and asked patient to bring sd card from cpap machine. He agreed nothing further need a  .

## 2019-02-04 ENCOUNTER — Ambulatory Visit (INDEPENDENT_AMBULATORY_CARE_PROVIDER_SITE_OTHER): Payer: Medicare Other | Admitting: Pulmonary Disease

## 2019-02-04 ENCOUNTER — Encounter: Payer: Self-pay | Admitting: Pulmonary Disease

## 2019-02-04 VITALS — BP 140/78 | HR 64 | Ht 71.0 in | Wt 201.6 lb

## 2019-02-04 DIAGNOSIS — G4733 Obstructive sleep apnea (adult) (pediatric): Secondary | ICD-10-CM | POA: Diagnosis not present

## 2019-02-04 MED ORDER — ESZOPICLONE 2 MG PO TABS: 2.0000 mg | ORAL_TABLET | Freq: Every evening | ORAL | 2 refills | Status: DC | PRN

## 2019-02-04 NOTE — Patient Instructions (Signed)
History of obstructive sleep Sleep maintenance insomnia  We will schedule you for home sleep study Trial with Lunesta  I will see you back in the office in about 3 months
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
## 2019-02-06 NOTE — Patient Instructions (Signed)
Scan blood sugar readings at least before all meals/and snacks Take Lantus 20u twice daily, approx. 12 hours appart Bring scanner to each visit Take R insulin before meal--and wait 30-45 min. Before eating. Try to exercise--working up to 30-40 min. 4X/wk.  Get OK from family doctor before starting.

## 2019-02-15 DIAGNOSIS — G4733 Obstructive sleep apnea (adult) (pediatric): Secondary | ICD-10-CM

## 2019-02-17 DIAGNOSIS — G4733 Obstructive sleep apnea (adult) (pediatric): Secondary | ICD-10-CM

## 2019-02-22 DIAGNOSIS — G4733 Obstructive sleep apnea (adult) (pediatric): Secondary | ICD-10-CM

## 2019-02-25 ENCOUNTER — Other Ambulatory Visit: Payer: Medicare Other

## 2019-02-28 ENCOUNTER — Ambulatory Visit: Payer: Medicare Other | Admitting: Endocrinology

## 2019-03-01 ENCOUNTER — Telehealth: Payer: Self-pay | Admitting: Pulmonary Disease

## 2019-03-01 ENCOUNTER — Other Ambulatory Visit (INDEPENDENT_AMBULATORY_CARE_PROVIDER_SITE_OTHER): Payer: Medicare Other

## 2019-03-01 DIAGNOSIS — E1165 Type 2 diabetes mellitus with hyperglycemia: Secondary | ICD-10-CM | POA: Diagnosis not present

## 2019-03-01 DIAGNOSIS — Z794 Long term (current) use of insulin: Secondary | ICD-10-CM | POA: Diagnosis not present

## 2019-03-01 DIAGNOSIS — G4733 Obstructive sleep apnea (adult) (pediatric): Secondary | ICD-10-CM

## 2019-03-01 LAB — BASIC METABOLIC PANEL
BUN: 14 mg/dL (ref 6–23)
CO2: 29 mEq/L (ref 19–32)
CREATININE: 1.13 mg/dL (ref 0.40–1.50)
Calcium: 9 mg/dL (ref 8.4–10.5)
Chloride: 100 mEq/L (ref 96–112)
GFR: 76.59 mL/min (ref >60.00)
Glucose, Bld: 230 mg/dL — ABNORMAL HIGH (ref 70–99)
Potassium: 3 mEq/L — ABNORMAL LOW (ref 3.5–5.1)
Sodium: 138 mEq/L (ref 135–145)

## 2019-03-01 NOTE — Telephone Encounter (Signed)
Dr. Wynona Neat has reviewed the home sleep test this showed Mild Osa.   Recommendations   Treatment options are CPAP with the settings auto 5 to 15 .    Weight loss measures .   Advise against driving while sleepy & against medication with sedative side effects.    Make appointment for 3 months for compliance with download with Dr. Wynona Neat.   ATC all numbers on the record and was unable to reach.

## 2019-03-01 NOTE — Telephone Encounter (Signed)
Pt retuning call CB# [phone]//kob

## 2019-03-01 NOTE — Telephone Encounter (Signed)
Spoke with the pt as well and his spouse and notified of results/recs per Dr Wynona Neat  Pt verbalized understanding  CPAP ordered  I tried to schedule his f/u for 3 months and he states will have to call back b/c he did not have his calendar  I advised to be sure he f/u 31-90 days after CPAP initiation for insurance purposes  Pt verbalized understanding of this

## 2019-03-02 LAB — FRUCTOSAMINE: Fructosamine: 409 umol/L — ABNORMAL HIGH (ref 0–285)

## 2019-03-05 ENCOUNTER — Telehealth: Payer: Self-pay | Admitting: Pulmonary Disease

## 2019-03-05 DIAGNOSIS — G4733 Obstructive sleep apnea (adult) (pediatric): Secondary | ICD-10-CM

## 2019-03-05 NOTE — Telephone Encounter (Signed)
Called patient, unable to reach as phone kept ringing busy. Will call back.

## 2019-03-05 NOTE — Telephone Encounter (Signed)
Adjust CPAP settings to 5-20 Let us try and obtain a download within the next 2 to 3 weeks to ascertain whether the pressure is adequate Office appointment in about 3 to 4 weeks

## 2019-03-05 NOTE — Telephone Encounter (Signed)
Called and spoke with patient, he stated that he was told he had mild OSA and his pressure on the CPAP was turned down to 5-15cm. Patient stated that he still snores really bad and he thinks that the pressure should be turned up a little more.   AO please advise, thank you

## 2019-03-06 ENCOUNTER — Other Ambulatory Visit: Payer: Self-pay

## 2019-03-06 ENCOUNTER — Ambulatory Visit (INDEPENDENT_AMBULATORY_CARE_PROVIDER_SITE_OTHER): Payer: Medicare Other | Admitting: Endocrinology

## 2019-03-06 ENCOUNTER — Encounter: Payer: Self-pay | Admitting: Endocrinology

## 2019-03-06 VITALS — BP 146/72 | HR 75 | Ht 71.0 in | Wt 204.4 lb

## 2019-03-06 DIAGNOSIS — E1165 Type 2 diabetes mellitus with hyperglycemia: Secondary | ICD-10-CM | POA: Diagnosis not present

## 2019-03-06 DIAGNOSIS — E876 Hypokalemia: Secondary | ICD-10-CM

## 2019-03-06 DIAGNOSIS — Z794 Long term (current) use of insulin: Secondary | ICD-10-CM | POA: Diagnosis not present

## 2019-03-06 NOTE — Patient Instructions (Addendum)
36 lantus in am and keep am sugar in range  Check blood sugars on waking up days a week  Also check blood sugars about 2 hours after meals and do this after different meals by rotation  Recommended blood sugar levels on waking up are 90-130 and about 2 hours after meal is 130-160  Please bring your blood sugar monitor to each visit, thank you  Go back to 5 potassium  Ask Dr Rexene Edison about spironolactone
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
## 2019-03-07 NOTE — Telephone Encounter (Signed)
Attempted to call pt but unable to reach and unable to leave a VM due to mailbox being full. Will try to call back later. 

## 2019-03-07 NOTE — Telephone Encounter (Signed)
Order has been placed per Dr. Olalere. 

## 2019-03-07 NOTE — Telephone Encounter (Signed)
Patient returned phone call, made aware of Dr. Wynona Neat recommendations. States he is okay with changing his cpap pressures. 1 month appt made.   Please send order to adjust cpap settings.   Call back # [phone]

## 2019-03-18 ENCOUNTER — Telehealth: Payer: Self-pay | Admitting: Endocrinology

## 2019-03-18 NOTE — Telephone Encounter (Signed)
Patient called to advise that he has received his insulin pump and needs to set up training

## 2019-04-01 ENCOUNTER — Other Ambulatory Visit: Payer: Self-pay | Admitting: Endocrinology

## 2019-04-02 NOTE — Telephone Encounter (Signed)
I spoke with patient's wife and told her that Dr. Lucianne Muss wants to wait until this virus passes before doing any pump starts.  She agreed and reported good understanding of this.

## 2019-04-18 LAB — BASIC METABOLIC PANEL
BUN: 21 (ref 4–21)
CO2: 24 — AB (ref 13–22)
Chloride: 101 (ref 99–108)
Creatinine: 1.4 — AB (ref 0.6–1.3)
Potassium: 3.7 (ref 3.4–5.3)
Sodium: 142 (ref 137–147)

## 2019-04-18 LAB — COMPREHENSIVE METABOLIC PANEL
Calcium: 9.2 (ref 8.7–10.7)
GFR calc Af Amer: 58
GFR calc non Af Amer: 50
Globulin: 103

## 2019-04-18 LAB — HEMOGLOBIN A1C: Hemoglobin A1C: 7.4

## 2019-04-24 ENCOUNTER — Ambulatory Visit: Payer: Medicare Other | Admitting: Nurse Practitioner

## 2019-04-25 ENCOUNTER — Ambulatory Visit: Payer: Medicare Other | Admitting: Nurse Practitioner

## 2019-04-25 ENCOUNTER — Encounter: Payer: Self-pay | Admitting: Nurse Practitioner

## 2019-04-25 ENCOUNTER — Other Ambulatory Visit: Payer: Self-pay

## 2019-04-25 DIAGNOSIS — G4733 Obstructive sleep apnea (adult) (pediatric): Secondary | ICD-10-CM | POA: Diagnosis not present

## 2019-04-25 NOTE — Patient Instructions (Signed)
Patient continues to benefit from CPAP with good compliance and control documented Continue CPAP at current settings Continue current medications Goal of 4 hours or more usage per night Maintain healthy weight Do not drive if drowsy  Follow up with Dr. Wynona Neat in 6 months or sooner if needed
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
## 2019-04-25 NOTE — Assessment & Plan Note (Signed)
Patient is tolerating his new CPAP well. He does plan to start lunesta to help with sleep.   Patient Instructions  Patient continues to benefit from CPAP with good compliance and control documented Continue CPAP at current settings Continue current medications Goal of 4 hours or more usage per night Maintain healthy weight Do not drive if drowsy  Follow up with Dr. Wynona Neat in 6 months or sooner if needed

## 2019-04-29 ENCOUNTER — Other Ambulatory Visit: Payer: Medicare Other

## 2019-05-01 ENCOUNTER — Ambulatory Visit (INDEPENDENT_AMBULATORY_CARE_PROVIDER_SITE_OTHER): Payer: Medicare Other | Admitting: Endocrinology

## 2019-05-01 ENCOUNTER — Other Ambulatory Visit: Payer: Self-pay

## 2019-05-01 ENCOUNTER — Encounter: Payer: Self-pay | Admitting: Endocrinology

## 2019-05-01 VITALS — BP 142/60 | HR 78 | Temp 99.1°F | Ht 71.0 in | Wt 201.0 lb

## 2019-05-01 DIAGNOSIS — E876 Hypokalemia: Secondary | ICD-10-CM

## 2019-05-01 DIAGNOSIS — E1165 Type 2 diabetes mellitus with hyperglycemia: Secondary | ICD-10-CM | POA: Diagnosis not present

## 2019-05-01 DIAGNOSIS — Z794 Long term (current) use of insulin: Secondary | ICD-10-CM | POA: Diagnosis not present

## 2019-05-01 LAB — POCT GLYCOSYLATED HEMOGLOBIN (HGB A1C): Hemoglobin A1C: 7.7 % — AB (ref 4.0–5.6)

## 2019-05-01 NOTE — Patient Instructions (Signed)
Spironolactone for lo potasssium  Adjust Humalog based on type of food eaten also
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
## 2019-05-09 ENCOUNTER — Ambulatory Visit: Payer: Medicare Other | Admitting: Pulmonary Disease

## 2019-06-02 ENCOUNTER — Other Ambulatory Visit: Payer: Self-pay | Admitting: Endocrinology

## 2019-07-01 ENCOUNTER — Other Ambulatory Visit (INDEPENDENT_AMBULATORY_CARE_PROVIDER_SITE_OTHER): Payer: Medicare Other

## 2019-07-01 ENCOUNTER — Other Ambulatory Visit: Payer: Self-pay

## 2019-07-01 DIAGNOSIS — Z794 Long term (current) use of insulin: Secondary | ICD-10-CM | POA: Diagnosis not present

## 2019-07-01 DIAGNOSIS — E1165 Type 2 diabetes mellitus with hyperglycemia: Secondary | ICD-10-CM

## 2019-07-02 LAB — BASIC METABOLIC PANEL
BUN: 17 mg/dL (ref 6–23)
CO2: 27 mEq/L (ref 19–32)
Calcium: 9 mg/dL (ref 8.4–10.5)
Chloride: 104 mEq/L (ref 96–112)
Creatinine, Ser: 1.3 mg/dL (ref 0.40–1.50)
GFR: 65.09 mL/min (ref >60.00)
Glucose, Bld: 76 mg/dL (ref 70–99)
Potassium: 3.7 mEq/L (ref 3.5–5.1)
Sodium: 140 mEq/L (ref 135–145)

## 2019-07-02 LAB — FRUCTOSAMINE: Fructosamine: 392 umol/L — ABNORMAL HIGH (ref 0–285)

## 2019-07-02 LAB — COMPREHENSIVE METABOLIC PANEL
ALT: 12 U/L (ref 0–53)
AST: 17 U/L (ref 0–37)
Albumin: 4.3 g/dL (ref 3.5–5.2)
Alkaline Phosphatase: 31 U/L — ABNORMAL LOW (ref 39–117)
BUN: 17 mg/dL (ref 6–23)
CO2: 27 mEq/L (ref 19–32)
Calcium: 9 mg/dL (ref 8.4–10.5)
Chloride: 104 mEq/L (ref 96–112)
Creatinine, Ser: 1.3 mg/dL (ref 0.40–1.50)
GFR: 65.09 mL/min (ref >60.00)
Glucose, Bld: 76 mg/dL (ref 70–99)
Potassium: 3.7 mEq/L (ref 3.5–5.1)
Sodium: 140 mEq/L (ref 135–145)
Total Bilirubin: 0.6 mg/dL (ref 0.2–1.2)
Total Protein: 7.4 g/dL (ref 6.0–8.3)

## 2019-07-02 LAB — HEMOGLOBIN A1C: Hgb A1c MFr Bld: 8.3 % — ABNORMAL HIGH (ref 4.6–6.5)

## 2019-07-04 ENCOUNTER — Ambulatory Visit (INDEPENDENT_AMBULATORY_CARE_PROVIDER_SITE_OTHER): Payer: Medicare Other | Admitting: Endocrinology

## 2019-07-04 ENCOUNTER — Other Ambulatory Visit: Payer: Self-pay

## 2019-07-04 ENCOUNTER — Encounter: Payer: Self-pay | Admitting: Endocrinology

## 2019-07-04 VITALS — BP 140/60 | HR 69 | Ht 71.0 in | Wt 198.0 lb

## 2019-07-04 DIAGNOSIS — Z794 Long term (current) use of insulin: Secondary | ICD-10-CM

## 2019-07-04 DIAGNOSIS — E1165 Type 2 diabetes mellitus with hyperglycemia: Secondary | ICD-10-CM | POA: Diagnosis not present

## 2019-07-04 DIAGNOSIS — I10 Essential (primary) hypertension: Secondary | ICD-10-CM | POA: Diagnosis not present

## 2019-07-04 NOTE — Patient Instructions (Signed)
15 units Humalog at supper unless eating small meal
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
## 2019-07-15 ENCOUNTER — Other Ambulatory Visit: Payer: Self-pay

## 2019-07-15 ENCOUNTER — Encounter: Payer: Medicare Other | Attending: Endocrinology | Admitting: Nutrition

## 2019-07-15 DIAGNOSIS — E1165 Type 2 diabetes mellitus with hyperglycemia: Secondary | ICD-10-CM
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
## 2019-07-15 NOTE — Progress Notes (Signed)
Cameron [name] was instructed on how to use the OmniPod insulin pump.  He had not read over the resource guide as instructed, so we reviewed how this pump delivers insulin and the fact that can stop taking his long acting insulin.  He did not take any this AM.  Settings were put into the pump by the patient per Dr. Ronnie Derby orders:  Basal rate:  MN: 0.5, 7AM: 0.7, 12PM: 0.9,   I/C ratio: 10, ISF: 50, target: 110 with correction over 140.  Timing: 4 hours, reverse correction on. He said he used to count carbs, but has not done this for a while.  We reviewed how to do this and he was given a sheet with 15 gram portions sizes of most carbs eaten.  He also downloaded the [employer] app for help with this in restaurants and other things not on the list.  We also reviewed how to read a label for this  Information.  He reported good understanding of this.  We reviewed how to give a bolus, and he re demonstrated this with X 2 correctly.  Stressed the need to put in blood sugar readings with each bolus.   He filled a pod with 150u if Novolog insulin and attached it to his right upper quadrant without any difficulty.  He is using a [employer] and we discussed the need to test blood sugars with a fingerstick when arrows are present.  He says that his [name] is off by 40-50 points when he checks with a fingerstick.  He was strongly advised to use only fingersticks for this.   He reported that he understood all topics and had no final questions, but wanted a call from me this evening for confirmation of these things.  I agreed to call him this evening after supper. Blood sugar was 138 before leaving today

## 2019-07-16 ENCOUNTER — Telehealth: Payer: Self-pay | Admitting: Nutrition

## 2019-07-16 ENCOUNTER — Ambulatory Visit (INDEPENDENT_AMBULATORY_CARE_PROVIDER_SITE_OTHER): Payer: Medicare Other | Admitting: Endocrinology

## 2019-07-16 ENCOUNTER — Encounter: Payer: Medicare Other | Admitting: Nutrition

## 2019-07-16 ENCOUNTER — Encounter: Payer: Self-pay | Admitting: Endocrinology

## 2019-07-16 VITALS — BP 160/70 | HR 69 | Ht 71.0 in | Wt 198.0 lb

## 2019-07-16 DIAGNOSIS — Z794 Long term (current) use of insulin: Secondary | ICD-10-CM

## 2019-07-16 DIAGNOSIS — E1165 Type 2 diabetes mellitus with hyperglycemia: Secondary | ICD-10-CM

## 2019-07-16 NOTE — Telephone Encounter (Signed)
After leaving here yesteday at 1PM, the patient's blood sugar was 138.  It dropped to 78 at 1:30, and then to 45 by 3PM.  He ate his lunch with no insulin, ( a hot dog, and chicken wings.  Blood sugar was 55 at 3:15 and 79 at 4PM.  Pt took 5u of insulin at 8PM for supper.  At 9PM when I called him, his blood sugar was 180 with 4u of IOB.  He did have 50 carbs for dinner.  He was told to drink 8 ounces of juice, which he did.  I talked him into making some changes to his basal rate:  We decreased his basal rates by 0.1u/hr and at [facility], we decreased his basal rate by 0.15u/hr.  He wanted to go to bed at 10 tonight, and he was told that if his blood sugar is under 140 he will need  And  20 grams of carbohydrate in the form of juice or regular soda.  He agreed to do this.  I will see him tomorrow morning at 10:30AM.

## 2019-07-17 ENCOUNTER — Encounter: Payer: Medicare Other | Admitting: Nutrition

## 2019-07-17 ENCOUNTER — Ambulatory Visit (INDEPENDENT_AMBULATORY_CARE_PROVIDER_SITE_OTHER): Payer: Medicare Other | Admitting: Endocrinology

## 2019-07-17 ENCOUNTER — Encounter: Payer: Self-pay | Admitting: Endocrinology

## 2019-07-17 ENCOUNTER — Other Ambulatory Visit: Payer: Self-pay

## 2019-07-17 DIAGNOSIS — Z794 Long term (current) use of insulin: Secondary | ICD-10-CM | POA: Diagnosis not present

## 2019-07-17 DIAGNOSIS — E1165 Type 2 diabetes mellitus with hyperglycemia: Secondary | ICD-10-CM | POA: Diagnosis not present
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
## 2019-07-18 ENCOUNTER — Ambulatory Visit (INDEPENDENT_AMBULATORY_CARE_PROVIDER_SITE_OTHER): Payer: Medicare Other | Admitting: Endocrinology

## 2019-07-18 ENCOUNTER — Other Ambulatory Visit: Payer: Self-pay

## 2019-07-18 ENCOUNTER — Ambulatory Visit: Payer: Medicare Other | Admitting: Endocrinology

## 2019-07-18 ENCOUNTER — Encounter: Payer: Self-pay | Admitting: Endocrinology

## 2019-07-18 DIAGNOSIS — Z794 Long term (current) use of insulin: Secondary | ICD-10-CM

## 2019-07-18 DIAGNOSIS — E1165 Type 2 diabetes mellitus with hyperglycemia: Secondary | ICD-10-CM

## 2019-07-18 MED ORDER — DEXCOM G6 RECEIVER DEVI: 1.0000 | 0 refills | Status: DC

## 2019-07-18 MED ORDER — DEXCOM G6 SENSOR MISC: 1.0000 | 4 refills | Status: DC

## 2019-07-18 MED ORDER — DEXCOM G6 TRANSMITTER MISC: 1.0000 | 3 refills | Status: DC
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
## 2019-07-18 NOTE — Patient Instructions (Signed)
Read over resource manual and information given on counting carbs.

## 2019-07-18 NOTE — Progress Notes (Signed)
See phone note on 07/15/19.  Pt.  Reported no low blood sugars last night Pt. Did not do a correct bolus for lunch or supper.  Not sure what/how he did.  He could not explain this.  We reviewed again how to give a bolus and why.  We reviewed again the IOB and what this means.  He says he understands this "a lot better now".  He did a bolus for me 3 times correctly, and he reported good understanding "now", of how to do this.  His resourse book was not reviewed last night, so we got this out again, and we went through the steps of how to give a bolus.  No other topics were reviewed today.   He reported no difficulty sleeping or wearing the pod.  Site appears normal with no discoloration.  He reported no difficulty counting carbs and we reviewed this again before leaving.  He reported having downloaded the Calorie king app but not having used it.

## 2019-07-18 NOTE — Patient Instructions (Signed)
Read over resource manual for this evening.   Read over carb counting literature given this evening. Call office if reading are over 250, or drop less than 70 today.

## 2019-07-19 NOTE — Patient Instructions (Addendum)
Read over Resource manual. Call 800 help telephone number for help

## 2019-07-19 NOTE — Progress Notes (Addendum)
Cameron [name] was able to give boluses correctly yesterday.  Questions were answered about the IOB again.  He was encouraged to read over his resource manual for a review of this.  He did not want to change out his pod today, so we reviewed how to do this, using his resource manual.  He reported that he could do this on his own tomorrow.  WE made changes to his basal rate per Dr. Ronnie Derby orders, and he did this with some help from me.  We reviewed again how  to do this, and he was shown in the resource manual where to find these directions, if he is needing make additional changes in the future.  We reviewed sick day guidelines, high and low blood sugar protocols, temp basal rate-when and how to do this.  He r demonstrated how to do this correctly.  He signed off the checklist, after we reviewed each topic, and had no final quesitons. WE also discussed how/why to set up a [employer] account.  He was given a handout for how to do this, and how to download his pump. He said he will review this tonight and do this.  He will call me with his user name and password.  My telephone number was given for this. After seeing Dr. Dwyane Dee, his basal rate was changed per dr. Ronnie Derby order

## 2019-07-22 ENCOUNTER — Telehealth: Payer: Self-pay | Admitting: Endocrinology

## 2019-07-22 NOTE — Telephone Encounter (Signed)
Pt is calling regarding the pump.  There are 29 units in his pump right now. This will not last him until the 31st, which is when the pod should be out. So is he to put more insulin in his pump or when it runs out replace it earlier?

## 2019-07-23 NOTE — Telephone Encounter (Signed)
Called pt and he stated that when he changed his omnipod Sunday, not all the insulin went into the pod because he did not have the pod stable on a hard surface when filling it with insulin. Pt stated that he has been bolusing with a separate needle and using the same type of insulin, rather than having the omnipod bolus, and using the omnipod only for basal insulin until this pod runs out and then he will properly fill the next pod and resume bolusing and receiving basal insulin from the omnipod.

## 2019-08-14 ENCOUNTER — Ambulatory Visit: Payer: Medicare Other | Admitting: Endocrinology

## 2019-10-02 ENCOUNTER — Telehealth: Payer: Self-pay | Admitting: Endocrinology

## 2019-10-02 NOTE — Telephone Encounter (Signed)
Completed.

## 2019-10-02 NOTE — Telephone Encounter (Signed)
-----   Message from Elayne Snare, MD sent at 10/01/2019  2:51 PM EDT ----- Regarding: No-show Please reschedule his missed appointment with labs

## 2019-10-03 ENCOUNTER — Encounter: Payer: Self-pay | Admitting: Endocrinology

## 2019-10-03 ENCOUNTER — Ambulatory Visit (INDEPENDENT_AMBULATORY_CARE_PROVIDER_SITE_OTHER): Payer: Medicare Other | Admitting: Endocrinology

## 2019-10-03 ENCOUNTER — Other Ambulatory Visit: Payer: Self-pay

## 2019-10-03 VITALS — BP 140/60 | HR 69 | Ht 71.0 in | Wt 195.6 lb

## 2019-10-03 DIAGNOSIS — Z23 Encounter for immunization: Secondary | ICD-10-CM | POA: Diagnosis not present

## 2019-10-03 DIAGNOSIS — I10 Essential (primary) hypertension: Secondary | ICD-10-CM | POA: Diagnosis not present

## 2019-10-03 DIAGNOSIS — E1165 Type 2 diabetes mellitus with hyperglycemia: Secondary | ICD-10-CM | POA: Diagnosis not present

## 2019-10-03 DIAGNOSIS — Z794 Long term (current) use of insulin: Secondary | ICD-10-CM | POA: Diagnosis not present

## 2019-10-03 LAB — POCT GLYCOSYLATED HEMOGLOBIN (HGB A1C): Hemoglobin A1C: 7.9 % — AB (ref 4.0–5.6)

## 2019-10-03 NOTE — Patient Instructions (Signed)
Take Rybelsus with half a glass of water every morning, may eat after about 30 minutes May have nausea in the first few days which should gradually go away  If blood sugars are starting to get low please call Make sure you bolus with every meal and do this before starting the weight
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
## 2019-10-31 ENCOUNTER — Other Ambulatory Visit: Payer: Self-pay

## 2019-10-31 DIAGNOSIS — Z20822 Contact with and (suspected) exposure to covid-19: Secondary | ICD-10-CM

## 2019-11-02 LAB — NOVEL CORONAVIRUS, NAA: SARS-CoV-2, NAA: DETECTED — AB

## 2019-11-07 ENCOUNTER — Other Ambulatory Visit: Payer: Self-pay | Admitting: Endocrinology

## 2019-11-13 ENCOUNTER — Ambulatory Visit: Payer: Medicare Other | Admitting: Endocrinology

## 2019-11-26 ENCOUNTER — Telehealth: Payer: Self-pay | Admitting: Nutrition

## 2019-11-26 ENCOUNTER — Other Ambulatory Visit: Payer: Self-pay

## 2019-11-26 MED ORDER — INSULIN LISPRO 100 UNIT/ML ~~LOC~~ SOLN: SUBCUTANEOUS | 2 refills | Status: DC

## 2019-11-26 NOTE — Telephone Encounter (Signed)
Patient  Reports that he is running out of insulin before the month is out.  He is using 50u/day (filling a cartridge with 150u) and replacing the cartridge every 3 days.

## 2019-11-26 NOTE — Telephone Encounter (Signed)
Adjusted dosing instructions on Rx and resent.

## 2019-12-02 NOTE — Telephone Encounter (Signed)
Opened in error

## 2019-12-19 ENCOUNTER — Ambulatory Visit: Payer: Medicare Other | Admitting: Endocrinology

## 2019-12-24 ENCOUNTER — Other Ambulatory Visit: Payer: Self-pay

## 2019-12-25 ENCOUNTER — Ambulatory Visit (INDEPENDENT_AMBULATORY_CARE_PROVIDER_SITE_OTHER): Payer: Medicare Other | Admitting: Endocrinology

## 2019-12-25 ENCOUNTER — Encounter: Payer: Self-pay | Admitting: Endocrinology

## 2019-12-25 VITALS — BP 130/68 | HR 98 | Ht 71.0 in | Wt 190.0 lb

## 2019-12-25 DIAGNOSIS — E1165 Type 2 diabetes mellitus with hyperglycemia: Secondary | ICD-10-CM

## 2019-12-25 DIAGNOSIS — Z794 Long term (current) use of insulin: Secondary | ICD-10-CM

## 2019-12-25 DIAGNOSIS — I10 Essential (primary) hypertension: Secondary | ICD-10-CM | POA: Diagnosis not present

## 2019-12-25 DIAGNOSIS — E7800 Pure hypercholesterolemia, unspecified: Secondary | ICD-10-CM
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
## 2020-02-05 ENCOUNTER — Other Ambulatory Visit: Payer: Self-pay

## 2020-02-06 ENCOUNTER — Ambulatory Visit (INDEPENDENT_AMBULATORY_CARE_PROVIDER_SITE_OTHER): Payer: Medicare Other | Admitting: Endocrinology

## 2020-02-06 ENCOUNTER — Encounter: Payer: Self-pay | Admitting: Endocrinology

## 2020-02-06 VITALS — BP 140/62 | HR 68 | Ht 71.0 in | Wt 194.8 lb

## 2020-02-06 DIAGNOSIS — E7800 Pure hypercholesterolemia, unspecified: Secondary | ICD-10-CM

## 2020-02-06 DIAGNOSIS — Z794 Long term (current) use of insulin: Secondary | ICD-10-CM

## 2020-02-06 DIAGNOSIS — E1165 Type 2 diabetes mellitus with hyperglycemia: Secondary | ICD-10-CM

## 2020-02-06 DIAGNOSIS — I10 Essential (primary) hypertension: Secondary | ICD-10-CM

## 2020-02-06 LAB — POCT GLYCOSYLATED HEMOGLOBIN (HGB A1C): Hemoglobin A1C: 6.7 % — AB (ref 4.0–5.6)

## 2020-02-06 NOTE — Patient Instructions (Addendum)
Bolus for all meals including in am and enter all carbs  Extra 2-4 units for any high fat content  Check on Freestyle libre v. 2 ofr Dexcom
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
## 2020-02-07 ENCOUNTER — Telehealth: Payer: Self-pay

## 2020-02-07 LAB — BASIC METABOLIC PANEL
BUN: 19 mg/dL (ref 6–23)
CO2: 28 mEq/L (ref 19–32)
Calcium: 9.5 mg/dL (ref 8.4–10.5)
Chloride: 105 mEq/L (ref 96–112)
Creatinine, Ser: 1.32 mg/dL (ref 0.40–1.50)
GFR: 63.85 mL/min (ref >60.00)
Glucose, Bld: 148 mg/dL — ABNORMAL HIGH (ref 70–99)
Potassium: 3.4 mEq/L — ABNORMAL LOW (ref 3.5–5.1)
Sodium: 139 mEq/L (ref 135–145)

## 2020-02-07 LAB — LIPID PANEL
Cholesterol: 123 mg/dL (ref 0–200)
HDL: 44.7 mg/dL (ref >39.00)
LDL Cholesterol: 50 mg/dL (ref 0–99)
NonHDL: 77.97
Total CHOL/HDL Ratio: 3
Triglycerides: 139 mg/dL (ref 0.0–149.0)
VLDL: 27.8 mg/dL (ref 0.0–40.0)

## 2020-02-07 LAB — MICROALBUMIN / CREATININE URINE RATIO
Creatinine,U: 273.4 mg/dL
Microalb Creat Ratio: 4.2 mg/g (ref 0.0–30.0)
Microalb, Ur: 11.4 mg/dL — ABNORMAL HIGH (ref 0.0–1.9)

## 2020-02-07 LAB — FRUCTOSAMINE: Fructosamine: 401 umol/L — ABNORMAL HIGH (ref 0–285)

## 2020-02-07 NOTE — Telephone Encounter (Signed)
Patient called his insurance per Dr Lucianne Muss request regarding Dexcom.  Patient states that his insurance co states that if Dr Lucianne Muss states that it is medically necessary for patient to have Dexcom, they will cover it.

## 2020-02-10 ENCOUNTER — Other Ambulatory Visit: Payer: Self-pay

## 2020-02-10 MED ORDER — DEXCOM G6 TRANSMITTER MISC: 1.0000 | 3 refills | Status: DC

## 2020-02-10 MED ORDER — DEXCOM G6 SENSOR MISC: 1.0000 | 3 refills | Status: DC

## 2020-02-10 MED ORDER — DEXCOM G6 RECEIVER DEVI: 1.0000 | 0 refills | Status: DC

## 2020-02-10 NOTE — Telephone Encounter (Signed)
Need to do a PA, freestyle Josephine Igo is reading falsely low

## 2020-02-10 NOTE — Telephone Encounter (Signed)
Per instructions of Dexcom Rep, Rx for Dexcom system and corresponding supplies were sent to [facility]. They have paired with Dex[employer] to expeditiously complete PA (if needed) and get pt set up with the Dexcom system.

## 2020-02-10 NOTE — Telephone Encounter (Signed)
Assuming this indicates a PA will be needed.

## 2020-03-07 ENCOUNTER — Other Ambulatory Visit: Payer: Self-pay | Admitting: Endocrinology

## 2020-03-26 ENCOUNTER — Telehealth: Payer: Self-pay

## 2020-03-26 NOTE — Telephone Encounter (Signed)
Received fax from [facility] stating that the patient's status of order for the [facility] G6 Receiver has been cancelled. The reason for cancellation is cited as being "does not want to be connected".  In addition, the status of the cartridge 300 unit- [employer] has been cancelled as well with the reason cited as "out of network".

## 2020-05-01 ENCOUNTER — Other Ambulatory Visit: Payer: Self-pay

## 2020-05-04 ENCOUNTER — Encounter: Payer: Self-pay | Admitting: Endocrinology

## 2020-05-04 ENCOUNTER — Other Ambulatory Visit: Payer: Self-pay

## 2020-05-04 ENCOUNTER — Ambulatory Visit (INDEPENDENT_AMBULATORY_CARE_PROVIDER_SITE_OTHER): Payer: Medicare HMO | Admitting: Endocrinology

## 2020-05-04 VITALS — BP 144/60 | HR 64 | Temp 97.8°F | Wt 200.0 lb

## 2020-05-04 DIAGNOSIS — I10 Essential (primary) hypertension: Secondary | ICD-10-CM

## 2020-05-04 DIAGNOSIS — E1165 Type 2 diabetes mellitus with hyperglycemia: Secondary | ICD-10-CM

## 2020-05-04 DIAGNOSIS — Z794 Long term (current) use of insulin: Secondary | ICD-10-CM | POA: Diagnosis not present

## 2020-05-04 LAB — POCT GLYCOSYLATED HEMOGLOBIN (HGB A1C): Hemoglobin A1C: 8.3 % — AB (ref 4.0–5.6)

## 2020-05-04 MED ORDER — GLUCOSE BLOOD VI STRP: ORAL_STRIP | 1 refills | Status: DC

## 2020-05-04 MED ORDER — FARXIGA 5 MG PO TABS: 5.0000 mg | ORAL_TABLET | Freq: Every day | ORAL | 3 refills | Status: DC
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
## 2020-05-04 NOTE — Patient Instructions (Signed)
Add 20 more carbs to each meal for boluses   Basal settings: Midnight = 0.65, 4 AM-8 AM = 0.60, 8 AM-12 noon = 1.0, 12 pm- 6 PM =1.25, 6 PM-9 PM = 1.35 and 9 PM--12 AM = 1.45  Call supplier for [employer]

## 2020-05-05 ENCOUNTER — Ambulatory Visit: Payer: Medicare Other | Admitting: Endocrinology

## 2020-05-05 ENCOUNTER — Telehealth: Payer: Self-pay | Admitting: Nutrition

## 2020-05-05 NOTE — Telephone Encounter (Signed)
Also please follow-up with him regarding switching to the Dexcom since his freestyle Cameron [name] is reading falsely low

## 2020-05-05 NOTE — Telephone Encounter (Signed)
Message left on machine that he wants Dr. Lucianne Muss to know that his creatinine is 1.2, and his kidney function is 68.  He says that his kidney doctor, Dr. Allena Katz,  this information if you need it.

## 2020-05-06 ENCOUNTER — Other Ambulatory Visit: Payer: Self-pay | Admitting: Endocrinology

## 2020-05-06 NOTE — Telephone Encounter (Signed)
After review of patient's chart, it was noted that a prescirption was sent to [facility] on 02/10/20.  I called them and they said this must be handled under DME, so script was sent to [facility].  After contacting [facility], they refused to send out supplies because there is a hold due to unpaid bills.  Patient wanted to know if his labs were ok to take this new medication??? I also informed him of the need to contact MD's office for these and any other concerns.  Telephone number to the office given for further clarification.

## 2020-05-06 NOTE — Telephone Encounter (Signed)
Received patient's permission to sign his name for insurance verification of Dexcom.  Paperwork with perscription for Dexcom filled out and put on Dr. Remus Blake desk

## 2020-05-06 NOTE — Telephone Encounter (Signed)
Please let patient know that his kidney test is adequate for starting Farxiga and we will recheck when he comes back.  I still have not seen a copy of the results from nephrologist

## 2020-05-06 NOTE — Telephone Encounter (Signed)
Called pt and left detailed voicemail regarding kidney functions and pt's ability to safely start taking [place].  DME issues can be addressed further by CDE, as this has already been undertaken.

## 2020-05-12 ENCOUNTER — Telehealth: Payer: Self-pay | Admitting: Endocrinology

## 2020-05-12 NOTE — Telephone Encounter (Signed)
Patient requests Anette Riedel to give him a call regarding wether or not he still needs to take the [place] medication. [phone]

## 2020-05-13 NOTE — Telephone Encounter (Signed)
Called pt and informed him of MD message. Pt will call insurance and check on coverage for Jardiance and/or Invokana.

## 2020-05-13 NOTE — Telephone Encounter (Signed)
The company allows it only once.  If the medication is too expensive he can check the cost of Jardiance and Invokana

## 2020-05-13 NOTE — Telephone Encounter (Signed)
Pt stated that when he took the coupon for Farxiga to the pharmacy, they told him that it had been used. Pt would like a new coupon. Do you have more?

## 2020-05-19 ENCOUNTER — Other Ambulatory Visit: Payer: Self-pay | Admitting: *Deleted

## 2020-05-19 MED ORDER — INSULIN LISPRO 100 UNIT/ML ~~LOC~~ SOLN: SUBCUTANEOUS | 2 refills | Status: DC

## 2020-05-28 ENCOUNTER — Other Ambulatory Visit: Payer: Self-pay

## 2020-05-28 MED ORDER — OMNIPOD DASH PODS (GEN 4) MISC: 2 refills | Status: DC

## 2020-06-01 ENCOUNTER — Other Ambulatory Visit: Payer: Medicare HMO

## 2020-06-04 ENCOUNTER — Other Ambulatory Visit: Payer: Self-pay

## 2020-06-04 ENCOUNTER — Encounter: Payer: Self-pay | Admitting: Endocrinology

## 2020-06-04 ENCOUNTER — Ambulatory Visit (INDEPENDENT_AMBULATORY_CARE_PROVIDER_SITE_OTHER): Payer: Medicare HMO | Admitting: Endocrinology

## 2020-06-04 VITALS — BP 130/60 | HR 69 | Ht 71.0 in | Wt 199.6 lb

## 2020-06-04 DIAGNOSIS — I10 Essential (primary) hypertension: Secondary | ICD-10-CM | POA: Diagnosis not present

## 2020-06-04 DIAGNOSIS — E1165 Type 2 diabetes mellitus with hyperglycemia: Secondary | ICD-10-CM

## 2020-06-04 DIAGNOSIS — E876 Hypokalemia: Secondary | ICD-10-CM

## 2020-06-04 DIAGNOSIS — Z794 Long term (current) use of insulin: Secondary | ICD-10-CM

## 2020-06-04 LAB — GLUCOSE, POCT (MANUAL RESULT ENTRY): POC Glucose: 134 mg/dl — AB (ref 70–99)

## 2020-06-04 MED ORDER — FREESTYLE LIBRE 2 SENSOR MISC: 3 refills | Status: DC

## 2020-06-04 NOTE — Patient Instructions (Signed)
Put in exact carbs or some extra for boluses  Bolus before each meal or large snack
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
## 2020-06-05 LAB — BASIC METABOLIC PANEL
BUN: 19 mg/dL (ref 6–23)
CO2: 26 mEq/L (ref 19–32)
Calcium: 8.8 mg/dL (ref 8.4–10.5)
Chloride: 106 mEq/L (ref 96–112)
Creatinine, Ser: 1.37 mg/dL (ref 0.40–1.50)
GFR: 61.12 mL/min (ref >60.00)
Glucose, Bld: 133 mg/dL — ABNORMAL HIGH (ref 70–99)
Potassium: 3.7 mEq/L (ref 3.5–5.1)
Sodium: 138 mEq/L (ref 135–145)

## 2020-06-05 LAB — FRUCTOSAMINE: Fructosamine: 373 umol/L — ABNORMAL HIGH (ref 0–285)

## 2020-06-05 NOTE — Progress Notes (Signed)
Please call to let patient know that the kidney results are within the normal range, continue [place].  Potassium okay

## 2020-06-08 ENCOUNTER — Telehealth: Payer: Self-pay | Admitting: Nutrition

## 2020-06-08 ENCOUNTER — Telehealth: Payer: Self-pay

## 2020-06-08 NOTE — Telephone Encounter (Signed)
Pt

## 2020-06-08 NOTE — Telephone Encounter (Signed)
Patient reports that he has not heard back from [facility] about his supplies/order.  He was given the number to [facility] to call to check on the status of this order

## 2020-06-08 NOTE — Telephone Encounter (Signed)
After speaking with patient regarding labs, pt requested that I notify L.Spagnola, RN,CDE to discuss the Dexcom CGM system.

## 2020-06-09 ENCOUNTER — Other Ambulatory Visit: Payer: Self-pay

## 2020-06-09 MED ORDER — DEXCOM G6 RECEIVER DEVI: 0 refills | Status: DC

## 2020-06-09 MED ORDER — DEXCOM G6 TRANSMITTER MISC: 2 refills | Status: DC

## 2020-06-09 MED ORDER — DEXCOM G6 SENSOR MISC: 0 refills | Status: DC

## 2020-06-09 NOTE — Telephone Encounter (Signed)
Okay to send prescriptions for Dexcom.  Previously they had referred him to [facility]: For prescriptions please copy the notes to Anette Riedel also

## 2020-06-09 NOTE — Telephone Encounter (Signed)
Rx has been sent to [facility]. More than likely, patient has to use DME company due to insurance.

## 2020-06-09 NOTE — Telephone Encounter (Signed)
Patient was not able to contact [facility] pharmacy.  I contacted [facility], and they have no copy of a script or patient information on him.  I told him that  I would have Dr. Lucianne Muss send a prescription to [facility] for his Dexcom, and if he does not hear back from them in 1 week, to let me know

## 2020-06-10 ENCOUNTER — Telehealth: Payer: Self-pay

## 2020-06-10 NOTE — Telephone Encounter (Signed)
PA for Omnipod DASH 5 pack has been approved.  Wilkins Aloi Key[employer]ia Clock - PA Case ID: [id number] Need help? Call us at [phone] Outcome Approvedtoday Your request has been approved Drug OmniPod 5 Pack Form [employer] Electronic PA Form [id number] NCPDP)

## 2020-06-10 NOTE — Telephone Encounter (Signed)
I will do that Thank you.

## 2020-06-10 NOTE — Telephone Encounter (Signed)
PA has been initiated via [email or web address] for Omnipod DASH 5 pack pods.    Jawaun Williamsen [employer]ia Clock - PA Case ID: [id number] Need help? Call us at [phone] Status Sent to [employer] Drug OmniPod 5 Pack Form [employer] Electronic PA Form [phone] NCPDP)

## 2020-06-16 ENCOUNTER — Other Ambulatory Visit: Payer: Self-pay | Admitting: Endocrinology

## 2020-08-04 ENCOUNTER — Ambulatory Visit (INDEPENDENT_AMBULATORY_CARE_PROVIDER_SITE_OTHER): Payer: Medicare HMO | Admitting: Endocrinology

## 2020-08-04 ENCOUNTER — Encounter: Payer: Self-pay | Admitting: Endocrinology

## 2020-08-04 ENCOUNTER — Other Ambulatory Visit: Payer: Self-pay

## 2020-08-04 VITALS — BP 132/70 | HR 80 | Ht 71.0 in | Wt 199.8 lb

## 2020-08-04 DIAGNOSIS — E1165 Type 2 diabetes mellitus with hyperglycemia: Secondary | ICD-10-CM | POA: Diagnosis not present

## 2020-08-04 DIAGNOSIS — Z794 Long term (current) use of insulin: Secondary | ICD-10-CM | POA: Diagnosis not present

## 2020-08-04 DIAGNOSIS — I10 Essential (primary) hypertension: Secondary | ICD-10-CM | POA: Diagnosis not present
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
## 2020-08-04 NOTE — Patient Instructions (Addendum)
Add 2-4 units extra for eating out or high fat meals Bolus before starting to eat  Compare fingersticks to [place] at high or low alarms

## 2020-08-05 LAB — POCT GLYCOSYLATED HEMOGLOBIN (HGB A1C): Hemoglobin A1C: 7 % — AB (ref 4.0–5.6)

## 2020-08-05 NOTE — Addendum Note (Signed)
Addended by: Iris Pert D on: 08/05/2020 08:03 AM   Modules accepted: Orders

## 2020-08-06 LAB — HM DIABETES EYE EXAM

## 2020-09-12 ENCOUNTER — Other Ambulatory Visit: Payer: Self-pay | Admitting: Endocrinology

## 2020-10-09 ENCOUNTER — Other Ambulatory Visit: Payer: Self-pay | Admitting: Endocrinology

## 2020-10-12 ENCOUNTER — Telehealth: Payer: Self-pay | Admitting: Endocrinology

## 2020-10-12 ENCOUNTER — Other Ambulatory Visit: Payer: Self-pay | Admitting: *Deleted

## 2020-10-12 MED ORDER — FREESTYLE LIBRE 2 SENSOR MISC: 1 refills | Status: DC

## 2020-10-12 NOTE — Telephone Encounter (Signed)
Rx has been sent to his pharmacy.

## 2020-10-12 NOTE — Telephone Encounter (Signed)
Patient called re: Patient went to [facility] to pick up Continuous Blood Gluc Sensor (FREESTYLE LIBRE 2 SENSOR) MISC, however the PHARM gave him the [facility] 14 day sensor not [place] 2. PHARM told patient that he needed to call Dr. Lucianne Muss because they need to have a RX from Dr. Lucianne Muss. Patient requests a RX from Dr. Lucianne Muss be sent for the following: Medication Refill Request  Did you call your pharmacy and request this refill first? Yes . If patient has not contacted pharmacy first, instruct them to do so for future refills.  . Remind them that contacting the pharmacy for their refill is the quickest method to get the refill.  . Refill policy also stated that it will take anywhere between 24-72 hours to receive the refill.    Name of medication?  Continuous Blood Gluc Sensor (FREESTYLE LIBRE 2 SENSOR) MISC  Is this a 90 day supply? No  Name and location of pharmacy?   [id number] [place], [place] - [address] Phone:  [phone]  Fax:  [phone]

## 2020-10-12 NOTE — Telephone Encounter (Signed)
Okay to send the prescription for the [place] 2 sensors, 2/month

## 2020-10-12 NOTE — Telephone Encounter (Signed)
Please advise 

## 2020-11-03 ENCOUNTER — Encounter: Payer: Self-pay | Admitting: Endocrinology

## 2020-11-04 ENCOUNTER — Other Ambulatory Visit: Payer: Self-pay | Admitting: Endocrinology

## 2020-11-05 ENCOUNTER — Ambulatory Visit: Payer: Medicare HMO | Admitting: Endocrinology

## 2020-11-16 ENCOUNTER — Other Ambulatory Visit: Payer: Self-pay

## 2020-11-16 ENCOUNTER — Ambulatory Visit: Payer: Medicare HMO | Admitting: Endocrinology

## 2020-11-16 ENCOUNTER — Encounter: Payer: Self-pay | Admitting: Endocrinology

## 2020-11-16 VITALS — BP 152/86 | HR 74 | Ht 71.0 in | Wt 201.4 lb

## 2020-11-16 DIAGNOSIS — Z23 Encounter for immunization: Secondary | ICD-10-CM

## 2020-11-16 DIAGNOSIS — E7800 Pure hypercholesterolemia, unspecified: Secondary | ICD-10-CM

## 2020-11-16 DIAGNOSIS — Z794 Long term (current) use of insulin: Secondary | ICD-10-CM

## 2020-11-16 DIAGNOSIS — E1165 Type 2 diabetes mellitus with hyperglycemia: Secondary | ICD-10-CM

## 2020-11-16 LAB — COMPREHENSIVE METABOLIC PANEL
ALT: 12 U/L (ref 0–53)
AST: 16 U/L (ref 0–37)
Albumin: 4.4 g/dL (ref 3.5–5.2)
Alkaline Phosphatase: 36 U/L — ABNORMAL LOW (ref 39–117)
BUN: 20 mg/dL (ref 6–23)
CO2: 29 mEq/L (ref 19–32)
Calcium: 9.5 mg/dL (ref 8.4–10.5)
Chloride: 101 mEq/L (ref 96–112)
Creatinine, Ser: 1.22 mg/dL (ref 0.40–1.50)
GFR: 57.58 mL/min — ABNORMAL LOW (ref >60.00)
Glucose, Bld: 174 mg/dL — ABNORMAL HIGH (ref 70–99)
Potassium: 3.1 mEq/L — ABNORMAL LOW (ref 3.5–5.1)
Sodium: 139 mEq/L (ref 135–145)
Total Bilirubin: 0.7 mg/dL (ref 0.2–1.2)
Total Protein: 8 g/dL (ref 6.0–8.3)

## 2020-11-16 LAB — HEMOGLOBIN A1C: Hgb A1c MFr Bld: 6.9 % — ABNORMAL HIGH (ref 4.6–6.5)
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
## 2021-01-08 ENCOUNTER — Other Ambulatory Visit: Payer: Self-pay

## 2021-01-10 ENCOUNTER — Other Ambulatory Visit: Payer: Self-pay | Admitting: Endocrinology

## 2021-01-11 ENCOUNTER — Other Ambulatory Visit: Payer: Self-pay | Admitting: Endocrinology

## 2021-01-12 ENCOUNTER — Ambulatory Visit: Payer: Medicare HMO | Admitting: Endocrinology

## 2021-01-19 ENCOUNTER — Ambulatory Visit: Payer: Medicare HMO | Admitting: Endocrinology

## 2021-01-22 ENCOUNTER — Other Ambulatory Visit: Payer: Self-pay | Admitting: Endocrinology

## 2021-02-09 ENCOUNTER — Ambulatory Visit (INDEPENDENT_AMBULATORY_CARE_PROVIDER_SITE_OTHER): Payer: Medicare HMO | Admitting: Endocrinology

## 2021-02-09 ENCOUNTER — Encounter: Payer: Self-pay | Admitting: Endocrinology

## 2021-02-09 ENCOUNTER — Encounter: Payer: Medicare HMO | Attending: Endocrinology | Admitting: Nutrition

## 2021-02-09 ENCOUNTER — Other Ambulatory Visit: Payer: Self-pay

## 2021-02-09 VITALS — BP 148/70 | HR 80 | Ht 71.0 in | Wt 202.8 lb

## 2021-02-09 DIAGNOSIS — Z794 Long term (current) use of insulin: Secondary | ICD-10-CM

## 2021-02-09 DIAGNOSIS — E876 Hypokalemia: Secondary | ICD-10-CM

## 2021-02-09 DIAGNOSIS — E7800 Pure hypercholesterolemia, unspecified: Secondary | ICD-10-CM | POA: Diagnosis not present

## 2021-02-09 DIAGNOSIS — E1165 Type 2 diabetes mellitus with hyperglycemia: Secondary | ICD-10-CM

## 2021-02-09 MED ORDER — DEXCOM G6 SENSOR MISC: 0 refills | Status: DC

## 2021-02-09 MED ORDER — DEXCOM G6 TRANSMITTER MISC: 2 refills | Status: DC

## 2021-02-09 NOTE — Patient Instructions (Signed)
Must BOLUS 10-15 MIN before eating every meal
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
## 2021-02-10 LAB — BASIC METABOLIC PANEL
BUN: 21 mg/dL (ref 6–23)
CO2: 28 mEq/L (ref 19–32)
Calcium: 9.4 mg/dL (ref 8.4–10.5)
Chloride: 102 mEq/L (ref 96–112)
Creatinine, Ser: 1.54 mg/dL — ABNORMAL HIGH (ref 0.40–1.50)
GFR: 43.47 mL/min — ABNORMAL LOW (ref >60.00)
Glucose, Bld: 205 mg/dL — ABNORMAL HIGH (ref 70–99)
Potassium: 3.7 mEq/L (ref 3.5–5.1)
Sodium: 139 mEq/L (ref 135–145)

## 2021-02-10 LAB — LIPID PANEL
Cholesterol: 124 mg/dL (ref 0–200)
HDL: 44.9 mg/dL (ref >39.00)
LDL Cholesterol: 52 mg/dL (ref 0–99)
NonHDL: 79.47
Total CHOL/HDL Ratio: 3
Triglycerides: 136 mg/dL (ref 0.0–149.0)
VLDL: 27.2 mg/dL (ref 0.0–40.0)

## 2021-02-10 LAB — FRUCTOSAMINE: Fructosamine: 362 umol/L — ABNORMAL HIGH (ref 0–285)

## 2021-02-10 LAB — HEMOGLOBIN A1C: Hgb A1c MFr Bld: 7.5 % — ABNORMAL HIGH (ref 4.6–6.5)

## 2021-02-10 NOTE — Patient Instructions (Signed)
Use list to add carbs eaten at each meal, and put it into the bolus section of your pump before each meal Always add a blood sugar to each bolus  Press add to calculator, and confirm and start to give the bolus before all meals.

## 2021-02-10 NOTE — Progress Notes (Signed)
Patient reports that he does not know how to make the needed basal changes and carb ratio changes.  He was again show how to do this.   We also reviewed how to give a bolus.  He says he forgets to do this and so we reviewed the need to do this before eating every meals, and put in meal reminders for all 3 meals.  We also reviewed how he does this.  When he does this, he will usually put in 20 carbs (2u) for his meals, despite eating more.  Today he ate a hamburger and fries, and cake, and said he would have put in 20 carbs for this.  I wrote down carbs for each item he normally eats (30 for hamburger bun, 15 for each piece of bread, etc.) and he said he will carry this with him to use as a reference.  We reviewed on his PDM how to do this and the need to put the blood sugar reading into the bolus calculator.  He reported that he understood this, and realized he was "doing this all wrong".  Steps were written on what to fill in when giving a bolus.  He thanked me for this,and had no final questions.

## 2021-02-12 NOTE — Progress Notes (Signed)
Please let him know that kidney test is somewhat worse so do not increase Farxiga to 10 mg.  Send copy of labs to cardiologist Dr. Sharyn Lull.  He needs to talk to him about getting back on diuretics because of kidney dysfunction

## 2021-02-14 ENCOUNTER — Other Ambulatory Visit: Payer: Self-pay | Admitting: Endocrinology

## 2021-02-14 DIAGNOSIS — E1165 Type 2 diabetes mellitus with hyperglycemia: Secondary | ICD-10-CM

## 2021-02-14 DIAGNOSIS — Z794 Long term (current) use of insulin: Secondary | ICD-10-CM

## 2021-02-24 ENCOUNTER — Ambulatory Visit: Payer: Medicare HMO | Admitting: Nutrition

## 2021-03-03 ENCOUNTER — Other Ambulatory Visit: Payer: Self-pay | Admitting: Endocrinology

## 2021-03-29 DIAGNOSIS — D849 Immunodeficiency, unspecified: Secondary | ICD-10-CM | POA: Insufficient documentation

## 2021-03-31 ENCOUNTER — Other Ambulatory Visit: Payer: Self-pay | Admitting: Endocrinology

## 2021-04-12 ENCOUNTER — Other Ambulatory Visit: Payer: Medicare HMO

## 2021-04-12 ENCOUNTER — Other Ambulatory Visit: Payer: Self-pay | Admitting: *Deleted

## 2021-04-12 MED ORDER — FREESTYLE LIBRE 2 SENSOR MISC: 1 refills | Status: DC

## 2021-04-14 ENCOUNTER — Encounter: Payer: Self-pay | Admitting: Endocrinology

## 2021-04-14 ENCOUNTER — Ambulatory Visit (INDEPENDENT_AMBULATORY_CARE_PROVIDER_SITE_OTHER): Payer: Medicare HMO | Admitting: Endocrinology

## 2021-04-14 ENCOUNTER — Other Ambulatory Visit: Payer: Self-pay

## 2021-04-14 VITALS — BP 164/72 | HR 78 | Ht 71.0 in | Wt 202.2 lb

## 2021-04-14 DIAGNOSIS — I10 Essential (primary) hypertension: Secondary | ICD-10-CM

## 2021-04-14 DIAGNOSIS — Z794 Long term (current) use of insulin: Secondary | ICD-10-CM | POA: Diagnosis not present

## 2021-04-14 DIAGNOSIS — E1165 Type 2 diabetes mellitus with hyperglycemia: Secondary | ICD-10-CM

## 2021-04-14 DIAGNOSIS — E876 Hypokalemia: Secondary | ICD-10-CM | POA: Diagnosis not present
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
## 2021-04-14 NOTE — Patient Instructions (Signed)
Ask about Sprinolactone  Bolus for all Carbs  Enter only actual sugar in pump  Check on Dexcom

## 2021-04-23 ENCOUNTER — Telehealth: Payer: Self-pay | Admitting: Dietician

## 2021-04-23 NOTE — Telephone Encounter (Signed)
Returned patient call. Patient was not available and left a message that another diabetes educator will call next week.  Patient stated that he has all of the supplies for the [facility] and needs training.  He also states that MD recommended that he change insulin pump brand (noted that he is forgetting to enter bolus in current pump) and wishes to discuss this.  Oran Rein, RD, LDN, CDCES

## 2021-04-26 NOTE — Telephone Encounter (Signed)
Pt. Scheduled for tomorrow.

## 2021-04-27 ENCOUNTER — Other Ambulatory Visit: Payer: Self-pay

## 2021-04-27 ENCOUNTER — Encounter: Payer: Medicare HMO | Attending: Endocrinology | Admitting: Nutrition

## 2021-04-27 ENCOUNTER — Telehealth: Payer: Self-pay | Admitting: Endocrinology

## 2021-04-27 DIAGNOSIS — E0821 Diabetes mellitus due to underlying condition with diabetic nephropathy: Secondary | ICD-10-CM | POA: Insufficient documentation

## 2021-04-27 NOTE — Telephone Encounter (Signed)
Pt called to see if the nurse could give him a call regarding his insulin instructions for his Dexcom. Pt states he just has a few questions.  [phone]

## 2021-04-27 NOTE — Telephone Encounter (Signed)
Patient was wanting the new OmniPod 5 and was wondering if this Dexcom was working with the one he has now.  I told him no.  He is just using the readings from the Dexcom to put into the space where it is marked BG when he is giving his bolus.  He reported good understanding of this.

## 2021-04-28 NOTE — Progress Notes (Signed)
Patient was trained on how to use the Dexcom sensor and transmitter.  The readings were sent to his phone and linked to [facility].  He inserted a sensor into his right upper outer arm and inserted the transmitter, and started the sensor.  He was told to read over manual and to call the help line if questions.  He reported good understanding and had no final questions.

## 2021-04-28 NOTE — Patient Instructions (Signed)
Change sensor every 10 days Change transmitter every 3 months.   

## 2021-05-04 ENCOUNTER — Other Ambulatory Visit: Payer: Self-pay | Admitting: Endocrinology

## 2021-05-07 ENCOUNTER — Telehealth: Payer: Self-pay | Admitting: Dietician

## 2021-05-07 NOTE — Telephone Encounter (Signed)
Returned patient call. He had to change out his Dexcom for the first time.  He was able to complete this earlier today with success. Patient misunderstood about the Transmitter.  Discussed that the Transmitter will continue to be reused for 3 months. Wipe it with alcohol prior to installing in the new sensor.  Patient had no further questions.  Oran Rein, RD, LDN, CDCES

## 2021-05-31 ENCOUNTER — Telehealth: Payer: Self-pay | Admitting: Endocrinology

## 2021-05-31 MED ORDER — DEXCOM G6 SENSOR MISC: 0 refills | Status: DC

## 2021-05-31 NOTE — Telephone Encounter (Signed)
Pt is needing a refill request on Continuous Blood Gluc Sensor (DEXCOM G6 SENSOR) MISC  [facility] [phone] - [place], [place] - [address]

## 2021-05-31 NOTE — Telephone Encounter (Signed)
Sent refill as requested.

## 2021-06-25 ENCOUNTER — Other Ambulatory Visit: Payer: Self-pay | Admitting: Endocrinology

## 2021-07-08 ENCOUNTER — Telehealth: Payer: Self-pay | Admitting: Endocrinology

## 2021-07-08 DIAGNOSIS — E1165 Type 2 diabetes mellitus with hyperglycemia: Secondary | ICD-10-CM

## 2021-07-08 MED ORDER — OMNIPOD DASH PODS (GEN 4) MISC
2 refills | Status: DC
Start: 2021-07-08 — End: 2022-01-20

## 2021-07-08 MED ORDER — DEXCOM G6 SENSOR MISC: 0 refills | Status: DC

## 2021-07-08 MED ORDER — DEXCOM G6 RECEIVER DEVI: 0 refills | Status: AC

## 2021-07-08 MED ORDER — DEXCOM G6 TRANSMITTER MISC: 2 refills | Status: DC

## 2021-07-08 NOTE — Telephone Encounter (Signed)
Patient came into office to request that his Dexcom G 6 and Omni Pod 5 pack Dash pods [facility] [address]    ** [facility] and patient initially paid out of pocket for products. Per [facility] will prompt the PA process for the coverage.  Patient also requested a gap RX for the Free Style 2 until the Dexcom situation is corrected  Call back # [phone]

## 2021-07-08 NOTE — Telephone Encounter (Signed)
I called patient to clarify on what exactly he needed for [place] to send to pharmacy. Patient stated that he needed everything for his Dexcom G6 to [facility] for them to start PA process with his insurance company. Patient has already paid for these items out of pocket because the Rx was sent elsewhere and insurance did not cover. I'm assuming insurance will reimburse patient once its sent to correct pharmacy. Patient also stated that he needed omnipod pods sent in to pharmacy for refill. Rx was sent for everything. Patient is currently using libre 2 but states he has another sensor to cover him until this PA process is complete for Dexcom.

## 2021-07-15 ENCOUNTER — Ambulatory Visit: Payer: Medicare HMO | Admitting: Endocrinology

## 2021-07-19 ENCOUNTER — Ambulatory Visit: Payer: Medicare HMO | Admitting: Endocrinology
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
## 2021-09-07 ENCOUNTER — Other Ambulatory Visit: Payer: Self-pay | Admitting: Endocrinology

## 2021-10-20 ENCOUNTER — Other Ambulatory Visit: Payer: Self-pay | Admitting: Endocrinology

## 2021-10-25 ENCOUNTER — Other Ambulatory Visit: Payer: Self-pay | Admitting: Endocrinology

## 2021-11-08 ENCOUNTER — Other Ambulatory Visit: Payer: Self-pay | Admitting: Endocrinology

## 2021-11-08 ENCOUNTER — Telehealth: Payer: Self-pay | Admitting: Dietician

## 2021-11-08 NOTE — Telephone Encounter (Signed)
Patient called stating that he had battery issues with his current Omnipod DASH and now has the new PDM.  He needs to transfer his current pump settings from his old DASH into the new PDM but wants assistance with this as he feels nervous doing this by himself.   Appointment made with Bonita Quin, CDCES for 11/10/2021.  Oran Rein, RD, LDN, CDCES

## 2021-11-10 ENCOUNTER — Other Ambulatory Visit: Payer: Self-pay

## 2021-11-10 ENCOUNTER — Encounter: Payer: Medicare HMO | Attending: Endocrinology | Admitting: Nutrition

## 2021-11-10 NOTE — Progress Notes (Signed)
Settings were put into his new PDM per DR. Kumar's last office note.  Basal rate: Midnight = 0.55, 8 AM-12 noon = 1.0, 12 pm- 4 PM = 1.25, 4 PM-9 PM = 1. 45; 9 PM--12 AM = 1.45   I/C: 8, ISF: 30, target: 100 with correction over 120, timing 4 hours. HE did  not have a pod on.  He was strongly advised to go home and get and put on a new pod.  He agreed to do this.

## 2021-11-13 ENCOUNTER — Other Ambulatory Visit: Payer: Self-pay | Admitting: Endocrinology

## 2021-11-26 ENCOUNTER — Ambulatory Visit: Payer: Medicare HMO | Attending: Family

## 2021-11-26 ENCOUNTER — Other Ambulatory Visit: Payer: Self-pay

## 2021-11-26 DIAGNOSIS — Z23 Encounter for immunization: Secondary | ICD-10-CM

## 2021-11-26 NOTE — Progress Notes (Signed)
   Covid-19 Vaccination Clinic  Name:  Cameron [name]    MRN: [id number] DOB: 09-02-1946  11/26/2021  Mr. Westrup was observed post Covid-19 immunization for 15 minutes without incident. He was provided with Vaccine Information Sheet and instruction to access the V-Safe system.   Mr. Cantera was instructed to call 911 with any severe reactions post vaccine: Difficulty breathing  Swelling of face and throat  A fast heartbeat  A bad rash all over body  Dizziness and weakness   Immunizations Administered     Name Date Dose VIS Date Route   Pfizer Covid-19 Vaccine Bivalent Booster 11/26/2021  9:15 AM 0.3 mL 08/25/2021 Intramuscular   Manufacturer: [employer], [employer]   Lot: [id number]   NDC: [phone]

## 2021-12-05 ENCOUNTER — Other Ambulatory Visit: Payer: Self-pay | Admitting: Endocrinology

## 2021-12-07 ENCOUNTER — Other Ambulatory Visit: Payer: Self-pay | Admitting: Endocrinology

## 2021-12-29 ENCOUNTER — Other Ambulatory Visit: Payer: Self-pay | Admitting: Endocrinology

## 2022-01-02 ENCOUNTER — Other Ambulatory Visit: Payer: Self-pay | Admitting: Endocrinology

## 2022-01-02 DIAGNOSIS — E1165 Type 2 diabetes mellitus with hyperglycemia: Secondary | ICD-10-CM

## 2022-01-13 ENCOUNTER — Other Ambulatory Visit: Payer: Self-pay | Admitting: Endocrinology

## 2022-01-13 DIAGNOSIS — Z794 Long term (current) use of insulin: Secondary | ICD-10-CM

## 2022-01-13 DIAGNOSIS — E1165 Type 2 diabetes mellitus with hyperglycemia: Secondary | ICD-10-CM

## 2022-01-14 ENCOUNTER — Other Ambulatory Visit: Payer: Self-pay | Admitting: Endocrinology

## 2022-01-17 ENCOUNTER — Telehealth: Payer: Self-pay | Admitting: Endocrinology

## 2022-01-17 DIAGNOSIS — E1165 Type 2 diabetes mellitus with hyperglycemia: Secondary | ICD-10-CM

## 2022-01-17 NOTE — Telephone Encounter (Signed)
Patient's wife called re: [facility] told her a Prior Authorization  is required by [employer] for the [employer] refill Pods-5 pack-GEN 4

## 2022-01-17 NOTE — Telephone Encounter (Signed)
Duplicate encounter

## 2022-01-17 NOTE — Telephone Encounter (Signed)
Patient called providing case number [id number] from [facility] for the below medication.   Insulin Disposable Pump (OMNIPOD DASH PODS, GEN 4,) MISC  Please provide confirmation to patient when completed via My Chart.

## 2022-01-18 ENCOUNTER — Other Ambulatory Visit: Payer: Self-pay | Admitting: Endocrinology

## 2022-01-18 ENCOUNTER — Encounter (HOSPITAL_COMMUNITY): Payer: Self-pay | Admitting: *Deleted

## 2022-01-18 ENCOUNTER — Other Ambulatory Visit (HOSPITAL_COMMUNITY): Payer: Self-pay

## 2022-01-18 DIAGNOSIS — E1165 Type 2 diabetes mellitus with hyperglycemia: Secondary | ICD-10-CM

## 2022-01-19 ENCOUNTER — Other Ambulatory Visit: Payer: Self-pay | Admitting: Endocrinology

## 2022-01-19 DIAGNOSIS — E1165 Type 2 diabetes mellitus with hyperglycemia: Secondary | ICD-10-CM

## 2022-01-20 ENCOUNTER — Other Ambulatory Visit: Payer: Self-pay | Admitting: Endocrinology

## 2022-01-20 ENCOUNTER — Other Ambulatory Visit (HOSPITAL_COMMUNITY): Payer: Self-pay

## 2022-01-20 DIAGNOSIS — E1165 Type 2 diabetes mellitus with hyperglycemia: Secondary | ICD-10-CM

## 2022-01-20 MED ORDER — OMNIPOD DASH PODS (GEN 4) MISC: 2 refills | Status: DC

## 2022-01-20 NOTE — Telephone Encounter (Signed)
Called and spoke with wife informed her of [facility] message. Wife stated that [facility] needed a new Rx sent. I sent new one

## 2022-01-20 NOTE — Telephone Encounter (Signed)
Patient called needing the Freestyle Libre 2 Sensor to be forward to the pharmacy.  [id number] [place], [place] - [address] Phone:  [phone]  Fax:  [phone]    Patient request confirmation when complete via MY CHART.  Also, patient request refill on Insulin Disposable Pump (OMNIPOD DASH PODS, GEN 4,) MISC to be sent to:  [id number] [place], [place] - [address] AT [facility] OF [facility] & [address] Phone:  [phone]  Fax:  [phone]

## 2022-01-20 NOTE — Addendum Note (Signed)
Addended by: Eliseo Squires on: 01/20/2022 04:39 PM   Modules accepted: Orders

## 2022-01-21 ENCOUNTER — Other Ambulatory Visit: Payer: Self-pay

## 2022-01-21 DIAGNOSIS — Z794 Long term (current) use of insulin: Secondary | ICD-10-CM

## 2022-01-21 DIAGNOSIS — E1165 Type 2 diabetes mellitus with hyperglycemia: Secondary | ICD-10-CM

## 2022-01-21 MED ORDER — METFORMIN HCL ER 500 MG PO TB24: ORAL_TABLET | ORAL | 2 refills | Status: DC

## 2022-01-21 MED ORDER — INSULIN ASPART 100 UNIT/ML IJ SOLN: INTRAMUSCULAR | 3 refills | Status: DC

## 2022-02-02 ENCOUNTER — Other Ambulatory Visit (HOSPITAL_COMMUNITY): Payer: Self-pay

## 2022-04-06 ENCOUNTER — Other Ambulatory Visit: Payer: Self-pay | Admitting: Endocrinology

## 2022-04-20 ENCOUNTER — Other Ambulatory Visit: Payer: Self-pay

## 2022-04-20 DIAGNOSIS — E1165 Type 2 diabetes mellitus with hyperglycemia: Secondary | ICD-10-CM

## 2022-05-05 ENCOUNTER — Other Ambulatory Visit: Payer: Self-pay | Admitting: Endocrinology

## 2022-05-21 ENCOUNTER — Other Ambulatory Visit: Payer: Self-pay | Admitting: Endocrinology

## 2022-05-31 ENCOUNTER — Telehealth: Payer: Self-pay | Admitting: Dietician

## 2022-05-31 NOTE — Telephone Encounter (Signed)
Patient called my office.   He states that he needs [facility] phone number to call them to obtain an appointment.  He is concerned that he has missed one. Appointment desk reviewed.  He was last seen by Dr. Lucianne Muss on 07/19/2021 and has not missed any appointments with him nor does he have any scheduled.   Endocrinology number provided so that he can make an appointment with Dr. Lucianne Muss.  Oran Rein, RD, LDN, CDCES

## 2022-06-17 ENCOUNTER — Other Ambulatory Visit: Payer: Self-pay | Admitting: Endocrinology

## 2022-07-05 ENCOUNTER — Encounter (HOSPITAL_COMMUNITY): Payer: Self-pay | Admitting: *Deleted

## 2022-07-08 ENCOUNTER — Telehealth: Payer: Self-pay

## 2022-07-08 NOTE — Telephone Encounter (Signed)
Patient called and left vm to call him back. Called patient no answer. Left vm to call office back.

## 2022-07-12 ENCOUNTER — Ambulatory Visit: Payer: Medicare HMO | Admitting: Endocrinology

## 2022-07-12 ENCOUNTER — Encounter: Payer: Self-pay | Admitting: Endocrinology

## 2022-07-12 VITALS — BP 130/64 | HR 67 | Ht 71.0 in | Wt 196.4 lb

## 2022-07-12 DIAGNOSIS — I10 Essential (primary) hypertension: Secondary | ICD-10-CM | POA: Diagnosis not present

## 2022-07-12 DIAGNOSIS — Z794 Long term (current) use of insulin: Secondary | ICD-10-CM

## 2022-07-12 DIAGNOSIS — E1165 Type 2 diabetes mellitus with hyperglycemia: Secondary | ICD-10-CM | POA: Diagnosis not present

## 2022-07-12 LAB — POCT GLYCOSYLATED HEMOGLOBIN (HGB A1C): Hemoglobin A1C: 8 % — AB (ref 4.0–5.6)

## 2022-07-12 MED ORDER — EMPAGLIFLOZIN 10 MG PO TABS: 10.0000 mg | ORAL_TABLET | Freq: Every day | ORAL | 3 refills | Status: DC
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
## 2022-07-12 NOTE — Patient Instructions (Signed)
Cameron [name] [phone]

## 2022-07-15 ENCOUNTER — Encounter: Payer: Self-pay | Admitting: Endocrinology

## 2022-08-08 ENCOUNTER — Telehealth: Payer: Self-pay | Admitting: Endocrinology

## 2022-08-08 NOTE — Telephone Encounter (Signed)
Patient came into the office to ask if an RX for the Dexcom G7 has been sent for him.  Patient advises that it has been approved by his insurance.  Is requesting a call back to [phone]

## 2022-08-09 NOTE — Telephone Encounter (Signed)
Spoke with patient and informed I would send order to [place] through [employer]

## 2022-08-15 ENCOUNTER — Ambulatory Visit: Payer: Medicare HMO | Admitting: Endocrinology

## 2022-08-18 ENCOUNTER — Encounter: Payer: Medicare HMO | Attending: Endocrinology | Admitting: Dietician

## 2022-08-18 ENCOUNTER — Other Ambulatory Visit: Payer: Self-pay | Admitting: Endocrinology

## 2022-08-18 DIAGNOSIS — E0821 Diabetes mellitus due to underlying condition with diabetic nephropathy: Secondary | ICD-10-CM | POA: Insufficient documentation

## 2022-08-18 DIAGNOSIS — E1165 Type 2 diabetes mellitus with hyperglycemia: Secondary | ICD-10-CM

## 2022-08-18 NOTE — Progress Notes (Signed)
Dexcom G7 Personal CGM Training  Start time:1300    End time:  1345 Total time: 45 Cameron [name] was educated about the following:  -Getting to know device    (Phone programmed ) -Setting up device (high alert  270  , low alert 70  ) -Inserting sensor ( right upper arm       WNL) -Trouble shooting -Tape guide, clarity information  He is in the [employer] and was giving the sharing code.  Patient has to do a funeral at 2 pm and has no time to do this now. -Reviewed insulin dosing from dexcom.   Patient has [facility] tech support and my contact information.  Oran Rein, RD, LDN, CDCES

## 2022-08-19 ENCOUNTER — Other Ambulatory Visit: Payer: Self-pay

## 2022-08-19 ENCOUNTER — Other Ambulatory Visit: Payer: Self-pay | Admitting: Endocrinology

## 2022-08-19 DIAGNOSIS — Z794 Long term (current) use of insulin: Secondary | ICD-10-CM

## 2022-08-19 MED ORDER — INSULIN ASPART 100 UNIT/ML IJ SOLN: INTRAMUSCULAR | 0 refills | Status: DC

## 2022-08-24 ENCOUNTER — Telehealth: Payer: Self-pay

## 2022-08-24 ENCOUNTER — Other Ambulatory Visit: Payer: Self-pay | Admitting: Internal Medicine

## 2022-08-24 MED ORDER — INSULIN LISPRO (1 UNIT DIAL) 100 UNIT/ML (KWIKPEN): PEN_INJECTOR | SUBCUTANEOUS | 0 refills | Status: DC

## 2022-08-24 NOTE — Telephone Encounter (Signed)
Medication changed to Humalog per Dr. Lucianne Muss

## 2022-08-24 NOTE — Telephone Encounter (Signed)
Novolog is not covered would you like to change

## 2022-08-26 ENCOUNTER — Other Ambulatory Visit: Payer: Self-pay | Admitting: Endocrinology

## 2022-08-26 DIAGNOSIS — E1165 Type 2 diabetes mellitus with hyperglycemia: Secondary | ICD-10-CM

## 2022-09-05 ENCOUNTER — Telehealth: Payer: Self-pay | Admitting: Dietician

## 2022-09-05 NOTE — Telephone Encounter (Signed)
Returned patient call. Patient states that his dexcom sensor fell off.  He request overpatches. He has a lab appointment on Thursday and he will pick some up at that time. He also complains that his sensor is reading low (40) and he checks with a finger prick which is 70.  MD to see on 9/21.  Will evaluate dexcom further at that time.  Oran Rein, RD, LDN, CDCES

## 2022-09-05 NOTE — Telephone Encounter (Signed)
Returned patient call. Patient left message that he had a question on the Dexcom.  Patient was not available.  Left message for him to return my call.  Oran Rein, RD, LDN, CDCES

## 2022-09-08 ENCOUNTER — Encounter: Payer: Medicare HMO | Attending: Endocrinology | Admitting: Dietician

## 2022-09-08 ENCOUNTER — Other Ambulatory Visit (INDEPENDENT_AMBULATORY_CARE_PROVIDER_SITE_OTHER): Payer: Medicare HMO

## 2022-09-08 DIAGNOSIS — E1165 Type 2 diabetes mellitus with hyperglycemia: Secondary | ICD-10-CM

## 2022-09-08 DIAGNOSIS — E0821 Diabetes mellitus due to underlying condition with diabetic nephropathy: Secondary | ICD-10-CM | POA: Insufficient documentation

## 2022-09-08 DIAGNOSIS — Z794 Long term (current) use of insulin: Secondary | ICD-10-CM

## 2022-09-08 LAB — BASIC METABOLIC PANEL
BUN: 15 mg/dL (ref 6–23)
CO2: 31 mEq/L (ref 19–32)
Calcium: 9.5 mg/dL (ref 8.4–10.5)
Chloride: 103 mEq/L (ref 96–112)
Creatinine, Ser: 1.41 mg/dL (ref 0.40–1.50)
GFR: 47.79 mL/min — ABNORMAL LOW (ref >60.00)
Glucose, Bld: 100 mg/dL — ABNORMAL HIGH (ref 70–99)
Potassium: 3.3 mEq/L — ABNORMAL LOW (ref 3.5–5.1)
Sodium: 142 mEq/L (ref 135–145)

## 2022-09-08 LAB — LIPID PANEL
Cholesterol: 109 mg/dL (ref 0–200)
HDL: 41.9 mg/dL (ref >39.00)
LDL Cholesterol: 43 mg/dL (ref 0–99)
NonHDL: 66.91
Total CHOL/HDL Ratio: 3
Triglycerides: 119 mg/dL (ref 0.0–149.0)
VLDL: 23.8 mg/dL (ref 0.0–40.0)

## 2022-09-08 LAB — MICROALBUMIN / CREATININE URINE RATIO
Creatinine,U: 167.4 mg/dL
Microalb Creat Ratio: 8.2 mg/g (ref 0.0–30.0)
Microalb, Ur: 13.8 mg/dL — ABNORMAL HIGH (ref 0.0–1.9)

## 2022-09-08 NOTE — Progress Notes (Signed)
Patient is here today alone. Retrained patient on the Dexcom G7 and started this. His last sensor bleed and he stated that it was not accurate compared to his blood glucose meter. Instructed that when a blood drop symbol shows on his dexcom to do a finger stick and showed him how to enter this into his app to calibrate his sensor.  He can do this anytime he is in doubt about accuracy.  He was able to demonstrate how to do this.  He is to call Dexcom related to a sensor that only worked one day. Discussed site placement of the Dexcom.  Oran Rein, RD, LDN, CDCES

## 2022-09-09 LAB — FRUCTOSAMINE: Fructosamine: 337 umol/L — ABNORMAL HIGH (ref 0–285)

## 2022-09-15 ENCOUNTER — Ambulatory Visit: Payer: Medicare HMO | Admitting: Endocrinology

## 2022-09-21 ENCOUNTER — Ambulatory Visit: Payer: Medicare HMO | Admitting: Endocrinology

## 2022-09-21 ENCOUNTER — Encounter: Payer: Self-pay | Admitting: Endocrinology

## 2022-09-21 VITALS — BP 136/68 | HR 71 | Ht 71.0 in | Wt 198.6 lb

## 2022-09-21 DIAGNOSIS — Z794 Long term (current) use of insulin: Secondary | ICD-10-CM | POA: Diagnosis not present

## 2022-09-21 DIAGNOSIS — E876 Hypokalemia: Secondary | ICD-10-CM

## 2022-09-21 DIAGNOSIS — E1165 Type 2 diabetes mellitus with hyperglycemia: Secondary | ICD-10-CM | POA: Diagnosis not present
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
## 2022-09-21 NOTE — Patient Instructions (Addendum)
Carb ratio 1:4  Bolus before every meal  Call Dexcom for calibration

## 2022-09-23 ENCOUNTER — Encounter: Payer: Self-pay | Admitting: Endocrinology

## 2022-10-06 ENCOUNTER — Ambulatory Visit (INDEPENDENT_AMBULATORY_CARE_PROVIDER_SITE_OTHER): Payer: Medicare HMO | Admitting: Pulmonary Disease

## 2022-10-06 ENCOUNTER — Encounter: Payer: Self-pay | Admitting: Pulmonary Disease

## 2022-10-06 VITALS — BP 138/70 | HR 67 | Temp 97.8°F | Ht 71.0 in | Wt 197.8 lb

## 2022-10-06 DIAGNOSIS — G4733 Obstructive sleep apnea (adult) (pediatric): Secondary | ICD-10-CM

## 2022-10-06 NOTE — Patient Instructions (Signed)
I will see you back in a year  Your CPAP seems to be working okay-your compliance shows that it is effective in treating the sleep apnea  Continue to stay active  Call us with significant concerns  If you have any concerns about the machine not working effectively, let us know and we will send in a prescription for an upgrade to the machine
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
## 2022-10-17 ENCOUNTER — Emergency Department (HOSPITAL_BASED_OUTPATIENT_CLINIC_OR_DEPARTMENT_OTHER): Payer: Medicare HMO

## 2022-10-17 ENCOUNTER — Encounter (HOSPITAL_BASED_OUTPATIENT_CLINIC_OR_DEPARTMENT_OTHER): Payer: Self-pay | Admitting: Emergency Medicine

## 2022-10-17 ENCOUNTER — Other Ambulatory Visit: Payer: Self-pay

## 2022-10-17 ENCOUNTER — Emergency Department (HOSPITAL_BASED_OUTPATIENT_CLINIC_OR_DEPARTMENT_OTHER)
Admission: EM | Admit: 2022-10-17 | Discharge: 2022-10-17 | Disposition: A | Discharge status: Home / Self Care | Payer: Medicare HMO | Attending: Emergency Medicine | Admitting: Emergency Medicine

## 2022-10-17 DIAGNOSIS — Z7984 Long term (current) use of oral hypoglycemic drugs: Secondary | ICD-10-CM | POA: Insufficient documentation

## 2022-10-17 DIAGNOSIS — I509 Heart failure, unspecified: Secondary | ICD-10-CM | POA: Diagnosis not present

## 2022-10-17 DIAGNOSIS — E1122 Type 2 diabetes mellitus with diabetic chronic kidney disease: Secondary | ICD-10-CM | POA: Diagnosis not present

## 2022-10-17 DIAGNOSIS — Z794 Long term (current) use of insulin: Secondary | ICD-10-CM | POA: Insufficient documentation

## 2022-10-17 DIAGNOSIS — R1310 Dysphagia, unspecified: Secondary | ICD-10-CM

## 2022-10-17 DIAGNOSIS — Z7982 Long term (current) use of aspirin: Secondary | ICD-10-CM | POA: Insufficient documentation

## 2022-10-17 DIAGNOSIS — Z79899 Other long term (current) drug therapy: Secondary | ICD-10-CM | POA: Diagnosis not present

## 2022-10-17 DIAGNOSIS — I2510 Atherosclerotic heart disease of native coronary artery without angina pectoris: Secondary | ICD-10-CM | POA: Diagnosis not present

## 2022-10-17 DIAGNOSIS — I130 Hypertensive heart and chronic kidney disease with heart failure and stage 1 through stage 4 chronic kidney disease, or unspecified chronic kidney disease: Secondary | ICD-10-CM | POA: Diagnosis not present

## 2022-10-17 DIAGNOSIS — N189 Chronic kidney disease, unspecified: Secondary | ICD-10-CM | POA: Insufficient documentation

## 2022-10-17 LAB — COMPREHENSIVE METABOLIC PANEL
ALT: 10 U/L (ref 0–44)
AST: 16 U/L (ref 15–41)
Albumin: 4.3 g/dL (ref 3.5–5.0)
Alkaline Phosphatase: 41 U/L (ref 38–126)
Anion gap: 10 (ref 5–15)
BUN: 17 mg/dL (ref 8–23)
CO2: 25 mmol/L (ref 22–32)
Calcium: 9.7 mg/dL (ref 8.9–10.3)
Chloride: 104 mmol/L (ref 98–111)
Creatinine, Ser: 1.32 mg/dL — ABNORMAL HIGH (ref 0.61–1.24)
GFR, Estimated: 55 mL/min — ABNORMAL LOW (ref >60)
Glucose, Bld: 89 mg/dL (ref 70–99)
Potassium: 3.1 mmol/L — ABNORMAL LOW (ref 3.5–5.1)
Sodium: 139 mmol/L (ref 135–145)
Total Bilirubin: 0.3 mg/dL (ref 0.3–1.2)
Total Protein: 7.7 g/dL (ref 6.5–8.1)

## 2022-10-17 LAB — CBC
HCT: 37.5 % — ABNORMAL LOW (ref 39.0–52.0)
Hemoglobin: 12 g/dL — ABNORMAL LOW (ref 13.0–17.0)
MCH: 26.2 pg (ref 26.0–34.0)
MCHC: 32 g/dL (ref 30.0–36.0)
MCV: 81.9 fL (ref 80.0–100.0)
Platelets: 200 10*3/uL (ref 150–400)
RBC: 4.58 MIL/uL (ref 4.22–5.81)
RDW: 15 % (ref 11.5–15.5)
WBC: 6 10*3/uL (ref 4.0–10.5)
nRBC: 0 % (ref 0.0–0.2)

## 2022-10-17 LAB — TROPONIN I (HIGH SENSITIVITY): Troponin I (High Sensitivity): 9 ng/L (ref <18)

## 2022-10-17 LAB — LIPASE, BLOOD: Lipase: 10 U/L — ABNORMAL LOW (ref 11–51)

## 2022-10-17 MED ORDER — PANTOPRAZOLE SODIUM 40 MG PO TBEC: 40.0000 mg | DELAYED_RELEASE_TABLET | Freq: Two times a day (BID) | ORAL | 0 refills | Status: AC
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
## 2022-10-17 NOTE — ED Notes (Signed)
Pt verbalizes understanding of discharge instructions. Opportunity for questioning and answers were provided. Pt discharged from ED to home with family.    

## 2022-10-17 NOTE — ED Triage Notes (Signed)
Indigestion  "feels like indigestion",  Was drinking water and felt like it got stuck,  Has been bringing up anything he tries to drink.  Last bm 10/16/2022 normal.   Started around 11pm

## 2022-10-17 NOTE — Discharge Instructions (Signed)
You were seen today with concerns for dysphagia.  You had difficulty swallowing.  Your labs and work-up was reassuring.  This self resolved.  Make sure that you are taking your Protonix.  If you have recurrent episodes, you should be evaluated by gastroenterology.

## 2022-10-27 ENCOUNTER — Other Ambulatory Visit: Payer: Self-pay | Admitting: Endocrinology

## 2022-11-23 ENCOUNTER — Telehealth: Payer: Self-pay | Admitting: Pulmonary Disease

## 2022-11-24 NOTE — Telephone Encounter (Signed)
Called and spoke with [facility] with [employer],

## 2022-11-24 NOTE — Telephone Encounter (Signed)
I verified with Nida Boatman that patient received his CPAP machine in 2020.  I called and spoke with patient and let him know that he received his machine was received in 2020 and does not qualify for a new CPAP machine.  He states that his wife tells him that he is still snoring when he uses his CPAP machine.  He says he gets his mask really tight, he knows it is tight enough as it leaves indentions on his face.  He uses nasal pillows and they works the best of all the masks he has tried and he says he has tried them all over the years.  He says his symptoms are much better. I pulled a download and his AHI is 0.7.   Advised to speak with someone at [employer] and see if they have any recommendations and if they cannot help him and it continues to call us back and see if Dr. Wynona Neat has further recommendations.  He verbalized understanding.  Nothing further needed.

## 2022-12-15 ENCOUNTER — Other Ambulatory Visit: Payer: Self-pay

## 2022-12-15 ENCOUNTER — Emergency Department (HOSPITAL_BASED_OUTPATIENT_CLINIC_OR_DEPARTMENT_OTHER): Payer: Medicare HMO | Admitting: Radiology

## 2022-12-15 ENCOUNTER — Emergency Department (HOSPITAL_BASED_OUTPATIENT_CLINIC_OR_DEPARTMENT_OTHER)
Admission: EM | Admit: 2022-12-15 | Discharge: 2022-12-15 | Disposition: A | Discharge status: Home / Self Care | Payer: Medicare HMO | Attending: Emergency Medicine | Admitting: Emergency Medicine

## 2022-12-15 ENCOUNTER — Encounter (HOSPITAL_BASED_OUTPATIENT_CLINIC_OR_DEPARTMENT_OTHER): Payer: Self-pay

## 2022-12-15 DIAGNOSIS — Z940 Kidney transplant status: Secondary | ICD-10-CM | POA: Diagnosis not present

## 2022-12-15 DIAGNOSIS — I129 Hypertensive chronic kidney disease with stage 1 through stage 4 chronic kidney disease, or unspecified chronic kidney disease: Secondary | ICD-10-CM | POA: Insufficient documentation

## 2022-12-15 DIAGNOSIS — Z7984 Long term (current) use of oral hypoglycemic drugs: Secondary | ICD-10-CM | POA: Diagnosis not present

## 2022-12-15 DIAGNOSIS — Z7982 Long term (current) use of aspirin: Secondary | ICD-10-CM | POA: Insufficient documentation

## 2022-12-15 DIAGNOSIS — Z794 Long term (current) use of insulin: Secondary | ICD-10-CM | POA: Insufficient documentation

## 2022-12-15 DIAGNOSIS — E1122 Type 2 diabetes mellitus with diabetic chronic kidney disease: Secondary | ICD-10-CM | POA: Insufficient documentation

## 2022-12-15 DIAGNOSIS — Z79899 Other long term (current) drug therapy: Secondary | ICD-10-CM | POA: Insufficient documentation

## 2022-12-15 DIAGNOSIS — R059 Cough, unspecified: Secondary | ICD-10-CM | POA: Diagnosis present

## 2022-12-15 DIAGNOSIS — U071 COVID-19: Secondary | ICD-10-CM | POA: Insufficient documentation

## 2022-12-15 DIAGNOSIS — N189 Chronic kidney disease, unspecified: Secondary | ICD-10-CM | POA: Insufficient documentation

## 2022-12-15 LAB — RESP PANEL BY RT-PCR (RSV, FLU A&B, COVID)  RVPGX2
Influenza A by PCR: NEGATIVE
Influenza B by PCR: NEGATIVE
Resp Syncytial Virus by PCR: NEGATIVE
SARS Coronavirus 2 by RT PCR: POSITIVE — AB

## 2022-12-15 MED ORDER — ACETAMINOPHEN 500 MG PO TABS
1000.0000 mg | ORAL_TABLET | Freq: Once | ORAL | Status: AC
  Administered 2022-12-15: 1000 mg via ORAL
  Filled 2022-12-15: qty 2

## 2022-12-15 MED ORDER — MOLNUPIRAVIR 200 MG PO CAPS: 4.0000 | ORAL_CAPSULE | Freq: Two times a day (BID) | ORAL | 0 refills | Status: AC

## 2022-12-15 NOTE — ED Notes (Signed)
Discharge paperwork given and verbally understood. 

## 2022-12-15 NOTE — ED Triage Notes (Signed)
Patient here POV form Home.  Endorses Mild Cough and Chills that began last PM. Performed Home COVID-19 Test which resulted Positive. Sent by PCP for Assessment.   NAD noted during Triage. A&Ox4. GCS 15. Ambulatory.

## 2022-12-15 NOTE — ED Notes (Signed)
BP elevated in Triage. Patient has not taken Home Medications PTA yet. No Antipyretics taken either.

## 2022-12-15 NOTE — ED Provider Notes (Signed)
[place] EMERGENCY DEPT Provider Note   CSN: [id number] Arrival date & time: 12/15/22  1449     History  Chief Complaint  Patient presents with   Cough    Cameron [name] is a 76 y.o. male.   Cough    Patient with medical history of hypertension, OSA, diabetes, CKD status post renal transplant presents to the emergency department due to positive COVID test.  Patient had bodyaches, nasal congestion and nonproductive cough starting yesterday.  Called his primary this morning requesting antivirals and primary treatment to the ED for further evaluation.  Patient denies any chest pain, shortness of breath, lower extremity swelling, headache, vision changes.  He did not take his blood pressure medicine prior to arrival and was hypertensive in triage at 230/86.  Home Medications Prior to Admission medications   Medication Sig Start Date End Date Taking? Authorizing Provider  molnupiravir EUA (LAGEVRIO) 200 MG CAPS capsule Take 4 capsules (800 mg total) by mouth 2 (two) times daily for 5 days. 12/15/22 12/20/22 Yes Sherrill Raring, PA-C  acetaminophen (TYLENOL) 650 MG CR tablet Take 1,300 mg by mouth every 8 (eight) hours as needed for pain.    [provider]  Alcohol Swabs (ALCOHOL PREP) 70 % PADS  02/28/18   [provider]  aspirin EC 325 MG EC tablet Take 1 tablet (325 mg total) by mouth 2 (two) times daily after a meal. 01/06/17   Loni Dolly, PA-C  Blood Glucose Calibration (OT ULTRA/FASTTK CNTRL SOLN) SOLN  10/15/14   [provider]  carvedilol (COREG) 25 MG tablet Take 25 mg by mouth 2 (two) times daily with a meal.    [provider]  CINNAMON PO Take 2,000 mg by mouth daily.    [provider]  cloNIDine (CATAPRES) 0.2 MG tablet Take 0.2 mg by mouth 2 (two) times daily.    [provider]  Continuous Blood Gluc Receiver (DEXCOM G6 RECEIVER) DEVI Use Dexcom Receiver to monitor blood sugar continuously. 07/08/21    Elayne Snare, MD  Continuous Blood Gluc Receiver (FREESTYLE LIBRE 2 READER) DEVI 1 each by Does not apply route See admin instructions. Use to monitor blood sugar continuously.    [provider]  Continuous Blood Gluc Sensor (FREESTYLE LIBRE 2 SENSOR) MISC USE AS DIRECTED. 01/20/22   Elayne Snare, MD  Continuous Blood Gluc Transmit (DEXCOM G6 TRANSMITTER) MISC Use one transmitter once every 90 days to transmit blood sugars. 07/08/21   Elayne Snare, MD  doxazosin (CARDURA) 8 MG tablet Take 8 mg by mouth 2 (two) times daily.    [provider]  insulin aspart (NOVOLOG FLEXPEN) 100 UNIT/ML FlexPen USE MAX 60 UNITS PER DAY WITH INSULIN PUMP 08/25/22   Elayne Snare, MD  Insulin Disposable Pump (OMNIPOD DASH PODS, GEN 4,) MISC APPLY AND CHANGE POD EVERY 3 DAYS AS DIRECTED TO ADMINISTER INSULIN 01/20/22   Elayne Snare, MD  Insulin Disposable Pump (OMNIPOD DASH SYSTEM) KIT 1 each by Does not apply route continuous. Use Omnipod Dash pump to administer insulin.    [provider]  isosorbide-hydrALAZINE (BIDIL) 20-37.5 MG per tablet Take 2 tablets by mouth 3 (three) times daily.    [provider]  JARDIANCE 10 MG TABS tablet TAKE 1 TABLET (10 MG TOTAL) BY MOUTH DAILY WITH BREAKFAST. 10/28/22   Elayne Snare, MD  lisinopril (PRINIVIL,ZESTRIL) 10 MG tablet Take 10 mg by mouth daily. 11/23/16   [provider]  magnesium oxide (MAG-OX) 400 MG tablet Take 400  mg by mouth daily. 2-3 hours after breakfast    [provider]  metFORMIN (GLUCOPHAGE-XR) 500 MG 24 hr tablet TAKE 3 TABLETS AT BEDTIME 08/26/22   Elayne Snare, MD  MYFORTIC 180 MG EC tablet Take 360 mg by mouth 2 (two) times daily.     [provider]  NIFEdipine (PROCARDIA XL/ADALAT-CC) 90 MG 24 hr tablet Take 90 mg by mouth daily.     [provider]  [facility] ULTRA test strip USE AS INSTRUCTED 06/16/20   Elayne Snare, MD  pantoprazole (PROTONIX) 40 MG tablet Take 1 tablet (40 mg total) by mouth 2  (two) times daily. 10/17/22   Horton, Barbette Hair, MD  potassium chloride SA (K-DUR,KLOR-CON) 20 MEQ tablet Take 20 mEq by mouth 4 (four) times daily.     [provider]  PROGRAF 1 MG capsule Take 2 mg by mouth 2 (two) times daily.     [provider]  rosuvastatin (CRESTOR) 20 MG tablet Take 20 mg by mouth every Monday, Wednesday, and Friday.    [provider]  TRAVATAN Z 0.004 % SOLN ophthalmic solution Place 1 drop into both eyes every evening. 10/18/16   [provider]  TURMERIC PO Take 1 tablet by mouth 2 (two) times daily.    [provider]  vitamin E 1000 UNIT capsule Take 1,000 Units by mouth daily.    [provider]      Allergies    Calcitriol and Ergocalciferol    Review of Systems   Review of Systems  Respiratory:  Positive for cough.     Physical Exam Updated Vital Signs BP (!) 166/74 (BP Location: Right Arm)   Pulse 64   Temp 100.2 F (37.9 C) (Oral)   Resp 16   Ht _0  (1.803 m)   Wt 85.7 kg   SpO2 99%   BMI 26.35 kg/m  Physical Exam Vitals and nursing note reviewed. Exam conducted with a chaperone present.  Constitutional:      Appearance: Normal appearance.  HENT:     Head: Normocephalic and atraumatic.  Eyes:     General: No scleral icterus.       Right eye: No discharge.        Left eye: No discharge.     Extraocular Movements: Extraocular movements intact.     Pupils: Pupils are equal, round, and reactive to light.  Cardiovascular:     Rate and Rhythm: Normal rate and regular rhythm.     Pulses: Normal pulses.     Heart sounds: Normal heart sounds. No murmur heard.    No friction rub. No gallop.  Pulmonary:     Effort: Pulmonary effort is normal. No respiratory distress.     Breath sounds: Normal breath sounds.     Comments: Speaking in complete sentences. Abdominal:     General: Abdomen is flat. Bowel sounds are normal. There is no distension.     Palpations: Abdomen is soft.      Tenderness: There is no abdominal tenderness.  Skin:    General: Skin is warm and dry.     Coloration: Skin is not jaundiced.  Neurological:     Mental Status: He is alert. Mental status is at baseline.     Coordination: Coordination normal.     ED Results / Procedures / Treatments   Labs (all labs ordered are listed, but only abnormal results are displayed) Labs Reviewed  RESP PANEL BY RT-PCR (RSV, FLU A&B, COVID)  [id number] -  Abnormal; Notable for the following components:      Result Value   SARS Coronavirus 2 by RT PCR POSITIVE (*)    All other components within normal limits    EKG None  Radiology DG Chest 2 View  Result Date: 12/15/2022 CLINICAL DATA:  Cough.  Positive home COVID test. EXAM: CHEST - 2 VIEW COMPARISON:  AP chest 10/17/2022 FINDINGS: Cardiac silhouette and mediastinal contours are within normal limits. Mild-to-moderate calcification within the aortic arch. The lungs are clear. No pleural effusion or pneumothorax. Mild-to-moderate multilevel degenerative disc changes of the thoracic spine. IMPRESSION: No active cardiopulmonary disease. Electronically Signed   By: Yvonne Kendall M.D.   On: 12/15/2022 17:28    Procedures Procedures    Medications Ordered in ED Medications  acetaminophen (TYLENOL) tablet 1,000 mg (1,000 mg Oral Given 12/15/22 1526)    ED Course/ Medical Decision Making/ A&P                           Medical Decision Making Amount and/or Complexity of Data Reviewed Radiology: ordered.  Risk OTC drugs. Prescription drug management.   Patient presents to the emergency department due to cough and a positive home COVID test.  He is without any chest pain, shortness of breath, lower extremity swelling, headache or systemic symptoms.  Will start with COVID test and chest x-ray and reevaluate.  Temperature was elevated in triage 100.2, Tylenol ordered.  He was also notably hypertensive with a systolic of 462, suspect this was due to not taking  his antihypertensive prior to arrival.    I reviewed external medical records including previous labs 10/17/22 with CR 1.32.   Patient is COVID-positive.  Chest x-ray is without any acute process.  Agree with radiologist.  Repeat blood pressure 192/84.  Patient has his home blood pressure medicine on him and just took it.  He is not hypoxic, not tachycardic and neuroexam is nonfocal.  Do not feel we need laboratory workup at this point as I have a very low suspicion for hypertensive emergency given lack of systemic involvement.  Will recheck blood pressure after home medicine.  I suspect patient symptoms are secondary to COVID.  Given he is not hypoxic having systemic ailments I think is reasonable to send him home with antiviral medication.  Discussed with attending who is in agreement with this plan.  Recheck blood pressure, still hypertensive but significantly improved at 166/74 after home medication.   Will discharge with antiviral medication and close outpatient follow-up with primary.  Do not see any indication for additional workup at this time.        Final Clinical Impression(s) / ED Diagnoses Final diagnoses:  [id number]    Rx / DC Orders ED Discharge Orders          Ordered    molnupiravir EUA (LAGEVRIO) 200 MG CAPS capsule  2 times daily        12/15/22 1855              Sherrill Raring, Hershal Coria 12/15/22 2011    Davonna Belling, MD 12/15/22 2317

## 2022-12-15 NOTE — Discharge Instructions (Signed)
You are seen today in the emergency department due to cough.  You are COVID-positive, I suspect that is the source of your symptoms.  Take the molnupiravir as prescribed.  Return to the ED for chest pain, shortness of breath or new or concerning symptoms.

## 2023-01-11 ENCOUNTER — Other Ambulatory Visit: Payer: Self-pay | Admitting: Endocrinology

## 2023-01-23 ENCOUNTER — Other Ambulatory Visit (INDEPENDENT_AMBULATORY_CARE_PROVIDER_SITE_OTHER): Payer: Medicare HMO

## 2023-01-23 ENCOUNTER — Other Ambulatory Visit: Payer: Self-pay

## 2023-01-23 ENCOUNTER — Other Ambulatory Visit: Payer: Medicare HMO

## 2023-01-23 DIAGNOSIS — Z794 Long term (current) use of insulin: Secondary | ICD-10-CM | POA: Diagnosis not present

## 2023-01-23 DIAGNOSIS — E1165 Type 2 diabetes mellitus with hyperglycemia: Secondary | ICD-10-CM

## 2023-01-23 LAB — BASIC METABOLIC PANEL WITH GFR
BUN: 16 mg/dL (ref 6–23)
CO2: 29 meq/L (ref 19–32)
Calcium: 9.4 mg/dL (ref 8.4–10.5)
Chloride: 104 meq/L (ref 96–112)
Creatinine, Ser: 1.56 mg/dL — ABNORMAL HIGH (ref 0.40–1.50)
GFR: 42.22 mL/min — ABNORMAL LOW
Glucose, Bld: 156 mg/dL — ABNORMAL HIGH (ref 70–99)
Potassium: 3.9 meq/L (ref 3.5–5.1)
Sodium: 142 meq/L (ref 135–145)

## 2023-01-23 LAB — HEMOGLOBIN A1C: Hgb A1c MFr Bld: 7.1 % — ABNORMAL HIGH (ref 4.6–6.5)

## 2023-01-23 MED ORDER — OMNIPOD DASH PODS (GEN 4) MISC: 2 refills | Status: DC

## 2023-01-25 ENCOUNTER — Ambulatory Visit: Payer: Medicare HMO | Admitting: Endocrinology

## 2023-01-25 ENCOUNTER — Encounter: Payer: Self-pay | Admitting: Endocrinology

## 2023-01-25 VITALS — BP 140/82 | HR 72 | Ht 71.0 in | Wt 199.6 lb

## 2023-01-25 DIAGNOSIS — E1165 Type 2 diabetes mellitus with hyperglycemia: Secondary | ICD-10-CM | POA: Diagnosis not present

## 2023-01-25 DIAGNOSIS — Z794 Long term (current) use of insulin: Secondary | ICD-10-CM

## 2023-01-25 DIAGNOSIS — N289 Disorder of kidney and ureter, unspecified: Secondary | ICD-10-CM

## 2023-01-25 MED ORDER — EMPAGLIFLOZIN 25 MG PO TABS: 25.0000 mg | ORAL_TABLET | Freq: Every day | ORAL | 3 refills | Status: DC

## 2023-01-25 NOTE — Patient Instructions (Addendum)
Metformin to 2 pills  Jardiance 2 of 10s
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
## 2023-01-26 ENCOUNTER — Encounter: Payer: Self-pay | Admitting: Endocrinology

## 2023-01-30 MED ORDER — TIRZEPATIDE 2.5 MG/0.5ML ~~LOC~~ SOAJ: 2.5000 mg | SUBCUTANEOUS | 0 refills | Status: DC

## 2023-01-30 NOTE — Addendum Note (Signed)
Addended by: Lauralyn Primes on: 01/30/2023 08:10 AM   Modules accepted: Orders

## 2023-02-07 ENCOUNTER — Encounter: Payer: Medicare HMO | Attending: Endocrinology | Admitting: Nutrition

## 2023-02-07 ENCOUNTER — Other Ambulatory Visit: Payer: Self-pay

## 2023-02-07 VITALS — Wt 199.8 lb

## 2023-02-07 DIAGNOSIS — E1165 Type 2 diabetes mellitus with hyperglycemia: Secondary | ICD-10-CM

## 2023-02-07 DIAGNOSIS — E0822 Diabetes mellitus due to underlying condition with diabetic chronic kidney disease: Secondary | ICD-10-CM | POA: Diagnosis present

## 2023-02-07 DIAGNOSIS — N185 Chronic kidney disease, stage 5: Secondary | ICD-10-CM

## 2023-02-07 MED ORDER — ACCU-CHEK GUIDE VI STRP: ORAL_STRIP | 3 refills | Status: AC

## 2023-02-07 NOTE — Patient Instructions (Addendum)
Press insulin vial,  put number of carbs eaten in carb box, put sensor reading into CGM box, and press add to calculator and deliver the bolus.  Use new meter and test strips for blood sugar verification

## 2023-02-07 NOTE — Progress Notes (Signed)
Patient says the sensors are not accurate.  He is using the [place] with his dash pump.  HE was testing his blood sugar with strips that were 2 years over the expiration date.  He was also not bolusing correctly on his pump.  I am not sure what he was doing, but he says he was not putting in the carb amounts in the right location, and not putting in the blood sugar readings in the bolus calculator at all!.  He could not tell me how he was giving his meal time insulin, except to say that he knows now how to do this.  We reviewed the steps again and he had no final questions.  We also discussed the difference between sensor readings and glucose readings and he reported good understanding of this.  He had no final questions.  Says he is not taking as much insulin, but could not tell me how much he is giving, or how he is calculating this.   Says blood sugars are low, and that he has taken no boluses for 2-3 days.  Patter of blood sugars are showing he is not going high after meal but 30-50 points.  Basal rate decreased by 0.1 , but stressed need to bolus as above for all meals.  He promised to call me in one week. He was given a Accu-Chek guide meter and shown how to use this.  He had no final questions.

## 2023-02-08 ENCOUNTER — Telehealth: Payer: Self-pay | Admitting: Nutrition

## 2023-02-08 NOTE — Telephone Encounter (Signed)
Patient reports that he was up all night with sensor going off saying urgently low.  He tested his blood sugar with a new sensor and it read 117.  Blood sugar now is reading 46, and meter says 117 again.  He will try a new dexcom sensor, and if still not accurate after 5 hours, will come in to try a [place] 3 sensor.

## 2023-02-08 NOTE — Telephone Encounter (Signed)
noted 

## 2023-02-17 ENCOUNTER — Telehealth: Payer: Self-pay | Admitting: Endocrinology

## 2023-02-17 DIAGNOSIS — E1165 Type 2 diabetes mellitus with hyperglycemia: Secondary | ICD-10-CM

## 2023-02-17 MED ORDER — TIRZEPATIDE 5 MG/0.5ML ~~LOC~~ SOAJ: 5.0000 mg | SUBCUTANEOUS | 3 refills | Status: DC

## 2023-02-17 NOTE — Telephone Encounter (Signed)
Rx sent 

## 2023-02-17 NOTE — Telephone Encounter (Signed)
Patient is here asking for another sample of Mounjaro or for a prescription to be sent in to   [place], [place] (Ph: [phone])

## 2023-03-12 ENCOUNTER — Other Ambulatory Visit: Payer: Self-pay

## 2023-03-12 DIAGNOSIS — E1165 Type 2 diabetes mellitus with hyperglycemia: Secondary | ICD-10-CM

## 2023-03-12 MED ORDER — OMNIPOD DASH PODS (GEN 4) MISC: 3 refills | Status: DC

## 2023-04-25 ENCOUNTER — Other Ambulatory Visit (INDEPENDENT_AMBULATORY_CARE_PROVIDER_SITE_OTHER): Payer: Medicare HMO

## 2023-04-25 DIAGNOSIS — E1165 Type 2 diabetes mellitus with hyperglycemia: Secondary | ICD-10-CM | POA: Diagnosis not present

## 2023-04-25 DIAGNOSIS — Z794 Long term (current) use of insulin: Secondary | ICD-10-CM | POA: Diagnosis not present

## 2023-04-25 LAB — GLUCOSE, RANDOM: Glucose, Bld: 150 mg/dL — ABNORMAL HIGH (ref 70–99)

## 2023-04-25 LAB — HEMOGLOBIN A1C: Hgb A1c MFr Bld: 6.9 % — ABNORMAL HIGH (ref 4.6–6.5)

## 2023-04-26 ENCOUNTER — Ambulatory Visit: Payer: Medicare HMO | Admitting: Endocrinology

## 2023-04-26 ENCOUNTER — Encounter: Payer: Self-pay | Admitting: Endocrinology

## 2023-04-26 VITALS — BP 118/48 | HR 71 | Ht 71.0 in | Wt 195.0 lb

## 2023-04-26 DIAGNOSIS — Z794 Long term (current) use of insulin: Secondary | ICD-10-CM | POA: Diagnosis not present

## 2023-04-26 DIAGNOSIS — E1165 Type 2 diabetes mellitus with hyperglycemia: Secondary | ICD-10-CM

## 2023-04-26 DIAGNOSIS — I10 Essential (primary) hypertension: Secondary | ICD-10-CM

## 2023-04-26 MED ORDER — OMNIPOD 5 DEXG7G6 PODS GEN 5 MISC: 1.0000 | 3 refills | Status: DC

## 2023-04-26 MED ORDER — OMNIPOD 5 DEXG7G6 INTRO GEN 5 KIT: 1.0000 | PACK | Freq: Once | 0 refills | Status: AC
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
## 2023-04-26 NOTE — Patient Instructions (Signed)
Take Mounjaro regularly  Take bolus before each meal or large snack  Estimate carbs more accurately

## 2023-04-28 ENCOUNTER — Encounter: Payer: Self-pay | Admitting: Endocrinology

## 2023-05-05 ENCOUNTER — Telehealth: Payer: Self-pay | Admitting: Dietician

## 2023-05-05 NOTE — Telephone Encounter (Signed)
Returned patient call.  He has an Omnipod 5 and needs training.  He currently uses the Dexcom G6.  He has insulin in a vial and asked that he bring this.  Appointment made for him on 05/10/2023 for training with LInda.  Oran Rein, RD, LDN, CDCES

## 2023-05-10 ENCOUNTER — Telehealth: Payer: Self-pay | Admitting: Nutrition

## 2023-05-10 ENCOUNTER — Encounter: Payer: Medicare HMO | Attending: Endocrinology | Admitting: Nutrition

## 2023-05-10 DIAGNOSIS — E118 Type 2 diabetes mellitus with unspecified complications: Secondary | ICD-10-CM

## 2023-05-10 NOTE — Patient Instructions (Signed)
Make sure to put in units of insulin required for the meal in the box that says carbs under the bolus calculator Also make sure that you put in your current blood sugar from the Dexcom sensor into the bolus wizard so that it can adjust for the high/low current blood sugar reading Call if questions.

## 2023-05-10 NOTE — Telephone Encounter (Signed)
Pt. Called saying he can not return the OmniPod 5 starter kit.  He still wants to use up all of his Dash pods before starting this.  He has a 2 month supply.  He wiill call me when he gets down to 3 pods.

## 2023-05-10 NOTE — Progress Notes (Signed)
Patient is here with his OmniPod5 pump and a new Dash pump that needs settings put in.  He is wearing a Dexcom G7 sensor that will not work with the [employer].  He does not want to use the Dexcom G6.  I explained that we can use the OmniPod 5 like a Dash system with his G7, but that it will not stop the flow of insulin if low or give more insulin if his blood sugars go high.  I also explained that we have not been told when the Omnipod 5 will work with the G7 sensors.  It may be one month or 6 months.  In any case, he will have to then pay for a new PDM.  He reports that this one cost him $500.00.  He also says that he has 2 months work of dash pods that he can use up, in hopes that the upgrade may happen by then, and he will return his OmniPod 5 for a OmniPod 6.   I set up his new Dash PDM with settings from his old PDM:  Basal rate: MN: ,0.55, 8AM: 1.05, 12PM: 1.4, 4PM: 1.55, 9PM: 1.45.  target 100 with corrections over 120, timing 4 hours, ISF: 25, and I/C which was 3, but patient says he does not count carbs and that he puts in units of insulin.  I changed the I/C ratio to 1.  We reviewed how to give a bolus putting in units of insulin and blood sugar readings.  He reports good understanding of this.

## 2023-05-11 NOTE — Telephone Encounter (Signed)
Visit completed.

## 2023-05-31 ENCOUNTER — Other Ambulatory Visit: Payer: Self-pay | Admitting: Endocrinology

## 2023-05-31 DIAGNOSIS — E1165 Type 2 diabetes mellitus with hyperglycemia: Secondary | ICD-10-CM

## 2023-07-06 ENCOUNTER — Other Ambulatory Visit: Payer: Self-pay | Admitting: Endocrinology

## 2023-07-06 DIAGNOSIS — E1165 Type 2 diabetes mellitus with hyperglycemia: Secondary | ICD-10-CM

## 2023-07-17 ENCOUNTER — Ambulatory Visit: Payer: Medicare HMO | Admitting: Nutrition

## 2023-07-21 ENCOUNTER — Other Ambulatory Visit: Payer: Medicare HMO

## 2023-07-24 ENCOUNTER — Encounter: Payer: Medicare HMO | Admitting: Endocrinology

## 2023-07-24 ENCOUNTER — Encounter: Payer: Medicare HMO | Attending: Endocrinology | Admitting: Nutrition

## 2023-07-24 ENCOUNTER — Encounter: Payer: Self-pay | Admitting: Endocrinology

## 2023-07-24 ENCOUNTER — Telehealth: Payer: Self-pay | Admitting: Nutrition

## 2023-07-24 ENCOUNTER — Other Ambulatory Visit: Payer: Self-pay

## 2023-07-24 VITALS — BP 190/100 | HR 81 | Ht 71.0 in | Wt 203.0 lb

## 2023-07-24 DIAGNOSIS — E1165 Type 2 diabetes mellitus with hyperglycemia: Secondary | ICD-10-CM

## 2023-07-24 DIAGNOSIS — E118 Type 2 diabetes mellitus with unspecified complications: Secondary | ICD-10-CM | POA: Insufficient documentation

## 2023-07-24 LAB — POCT GLYCOSYLATED HEMOGLOBIN (HGB A1C): Hemoglobin A1C: 6.9 % — AB (ref 4.0–5.6)

## 2023-07-24 MED ORDER — DEXCOM G6 SENSOR MISC: 1.0000 | 45 refills | Status: DC

## 2023-07-24 NOTE — Patient Instructions (Signed)
Change G6 sensor every 10 days, remembering to save the transmitter and reusing this. Change pod every 3 days.   Call OmniPod help line if questions about pump usage

## 2023-07-24 NOTE — Telephone Encounter (Signed)
We have started him on the OmniPod 5 pump.  He will need the Dexcom G6 sensors.  1 Q 10 days.  Please order these at the [facility] pharmacy on [address]   He will not need the transmitter.   Thank you

## 2023-07-24 NOTE — Progress Notes (Signed)
Patient was trained on the use of the OmniPod 5 pump.  Settings were transferred from his Dash system  Basal rate MN: 0.55, 8AM: 1.05, 12PM: 1.4, 4PM: 1.55, 9PM: 1.45.  ISF: 25, timing: 4 hours, target: 110 with correction over 120, I/C: 1.   Patient reports that he is only taking 2u for his bfast and lunch and 3u for supper.  Diet history reveals he is eating 45-55 acB, 90-100g acL due to sweet drinks, and supper is 90-100grams due to desert.  He promised to stop all sweet drinks, and was told to take 3u acB, 5u acL (7u if fries), and 2u for snack at 6PM, 8u acS (due to desert every night).  Written instructions given for this. He is wearing the G7 sensor and is wanting to go back to the G6 due to automated mode function.  We downloaded the G6 app and he was given a sensor.  This was linked to the PDM.  The PDM was linked to [place] [facility] [place] [facility].   He was shown X4 how to bolus, and re demonstrated this correctly.  His G6 sensor needs 2 hours to warm up, and has not found the PDM as yet.  Stressed need to put blood sugar readings in for every bolus, and only put the insulin dose for meals, and not the correction, as he was doing before.  He reported good understanding of this. I will call him tomorrow to see if his sensor has found his PDM and phone app. His pump was put in automated mode, and we discussed what this means and how this works.  He had no final questions.

## 2023-07-24 NOTE — Progress Notes (Unsigned)
This encounter was created in error - please disregard.

## 2023-07-25 NOTE — Telephone Encounter (Signed)
Thank you :)

## 2023-07-25 NOTE — Telephone Encounter (Signed)
Cameron [name] reported that his dexcom G6 sensor has shown up on his PDM.  Says is doing boluses as directed.  We reviewed this and he was reminded that he needs to pump in the insulin amount in the carb box that is needed to cover his meal and tap "use sensor" to put the blood sugar reading from the front screen into his bolus calculator.  He reported good understanding of this and had no questions for me at this time. Says FBS was 110 this morning

## 2023-07-26 ENCOUNTER — Encounter: Payer: Self-pay | Admitting: Endocrinology

## 2023-07-26 ENCOUNTER — Ambulatory Visit: Payer: Medicare HMO | Admitting: Endocrinology

## 2023-07-26 VITALS — BP 170/80 | HR 64 | Ht 71.0 in | Wt 205.8 lb

## 2023-07-26 DIAGNOSIS — I10 Essential (primary) hypertension: Secondary | ICD-10-CM

## 2023-07-26 DIAGNOSIS — E1165 Type 2 diabetes mellitus with hyperglycemia: Secondary | ICD-10-CM | POA: Diagnosis not present

## 2023-07-26 DIAGNOSIS — Z794 Long term (current) use of insulin: Secondary | ICD-10-CM | POA: Diagnosis not present

## 2023-07-26 MED ORDER — TIRZEPATIDE 5 MG/0.5ML ~~LOC~~ SOAJ: 5.0000 mg | SUBCUTANEOUS | 3 refills | Status: DC

## 2023-07-26 NOTE — Patient Instructions (Signed)
Take 8-10 units for lunch and 10-15 dinner
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
## 2023-08-25 ENCOUNTER — Other Ambulatory Visit: Payer: Self-pay

## 2023-08-25 MED ORDER — OMNIPOD 5 DEXG7G6 INTRO GEN 5 KIT: PACK | 0 refills | Status: DC

## 2023-08-30 ENCOUNTER — Other Ambulatory Visit: Payer: Self-pay

## 2023-08-30 MED ORDER — OMNIPOD 5 DEXG7G6 PODS GEN 5 MISC: 1.0000 | 3 refills | Status: DC

## 2023-08-30 MED ORDER — OMNIPOD 5 DEXG7G6 INTRO GEN 5 KIT: PACK | 0 refills | Status: AC

## 2023-09-29 ENCOUNTER — Telehealth: Payer: Self-pay | Admitting: Endocrinology

## 2023-09-29 ENCOUNTER — Other Ambulatory Visit: Payer: Self-pay

## 2023-09-29 DIAGNOSIS — E1165 Type 2 diabetes mellitus with hyperglycemia: Secondary | ICD-10-CM

## 2023-09-29 MED ORDER — OMNIPOD 5 DEXG7G6 PODS GEN 5 MISC
1.0000 | 3 refills | Status: DC
Start: 2023-09-29 — End: 2024-09-23

## 2023-09-29 NOTE — Telephone Encounter (Addendum)
MEDICATION:  Omnipod 5 G6 Pods (Gen 5) Insulin Disposable Pump (OMNIPOD 5 G6 PODS, GEN 5,) MISC  PHARMACY:    [id number] - [place], [place] - [address] (Ph: [phone])    HAS THE PATIENT CONTACTED THEIR PHARMACY?  Yes  IS THIS A 90 DAY SUPPLY : Yes  IS PATIENT OUT OF MEDICATION: Yes  IF NOT; HOW MUCH IS LEFT:   LAST APPOINTMENT DATE: @7 /31/2024  NEXT APPOINTMENT DATE:@Visit  date not found  DO WE HAVE YOUR PERMISSION TO LEAVE A DETAILED MESSAGE?:Yes  OTHER COMMENTS: Patient's wife called to say that she would like prescription [facility]   **Let patient know to contact pharmacy at the end of the day to make sure medication is ready. **  ** Please notify patient to allow 48-72 hours to process**  **Encourage patient to contact the pharmacy for refills or they can request refills through [facility]**

## 2023-09-29 NOTE — Telephone Encounter (Signed)
MEDICATION:  Omnipod 5 G6 Pods (Gen 5) Insulin Disposable Pump (OMNIPOD 5 G6 PODS, GEN 5,) MISC    PODS WAS SENT TO   PHARMACY:    [id number] [place], [place] - [address] (Ph: [phone])

## 2023-11-08 ENCOUNTER — Telehealth: Payer: Self-pay | Admitting: Endocrinology

## 2023-11-08 NOTE — Telephone Encounter (Signed)
Patient is requesting an RX for the Dexcom G6 Transmitter [facility] [address]

## 2023-11-09 ENCOUNTER — Other Ambulatory Visit: Payer: Self-pay

## 2023-11-09 DIAGNOSIS — E1165 Type 2 diabetes mellitus with hyperglycemia: Secondary | ICD-10-CM

## 2023-11-09 MED ORDER — DEXCOM G6 TRANSMITTER MISC
2 refills | Status: AC
Start: 2023-11-09 — End: ?

## 2023-12-06 ENCOUNTER — Emergency Department (HOSPITAL_BASED_OUTPATIENT_CLINIC_OR_DEPARTMENT_OTHER)
Admission: EM | Admit: 2023-12-06 | Discharge: 2023-12-06 | Disposition: A | Discharge status: Home / Self Care | Payer: Medicare HMO | Attending: Emergency Medicine | Admitting: Emergency Medicine

## 2023-12-06 ENCOUNTER — Other Ambulatory Visit: Payer: Self-pay

## 2023-12-06 ENCOUNTER — Encounter (HOSPITAL_BASED_OUTPATIENT_CLINIC_OR_DEPARTMENT_OTHER): Payer: Self-pay | Admitting: Emergency Medicine

## 2023-12-06 ENCOUNTER — Other Ambulatory Visit (HOSPITAL_BASED_OUTPATIENT_CLINIC_OR_DEPARTMENT_OTHER): Payer: Self-pay

## 2023-12-06 DIAGNOSIS — Z794 Long term (current) use of insulin: Secondary | ICD-10-CM | POA: Diagnosis not present

## 2023-12-06 DIAGNOSIS — Z7984 Long term (current) use of oral hypoglycemic drugs: Secondary | ICD-10-CM | POA: Insufficient documentation

## 2023-12-06 DIAGNOSIS — R8289 Other abnormal findings on cytological and histological examination of urine: Secondary | ICD-10-CM | POA: Insufficient documentation

## 2023-12-06 DIAGNOSIS — E119 Type 2 diabetes mellitus without complications: Secondary | ICD-10-CM | POA: Insufficient documentation

## 2023-12-06 DIAGNOSIS — R531 Weakness: Secondary | ICD-10-CM | POA: Insufficient documentation

## 2023-12-06 DIAGNOSIS — I10 Essential (primary) hypertension: Secondary | ICD-10-CM | POA: Insufficient documentation

## 2023-12-06 DIAGNOSIS — Z7982 Long term (current) use of aspirin: Secondary | ICD-10-CM | POA: Insufficient documentation

## 2023-12-06 DIAGNOSIS — Z940 Kidney transplant status: Secondary | ICD-10-CM | POA: Diagnosis not present

## 2023-12-06 DIAGNOSIS — R5383 Other fatigue: Secondary | ICD-10-CM | POA: Insufficient documentation

## 2023-12-06 LAB — URINALYSIS, ROUTINE W REFLEX MICROSCOPIC
Bilirubin Urine: NEGATIVE
Glucose, UA: 500 mg/dL — AB
Hgb urine dipstick: NEGATIVE
Ketones, ur: NEGATIVE mg/dL
Nitrite: NEGATIVE
Protein, ur: 30 mg/dL — AB
Specific Gravity, Urine: 1.019 (ref 1.005–1.030)
pH: 7 (ref 5.0–8.0)

## 2023-12-06 LAB — TROPONIN I (HIGH SENSITIVITY): Troponin I (High Sensitivity): 13 ng/L (ref <18)

## 2023-12-06 LAB — CBC
HCT: 33.1 % — ABNORMAL LOW (ref 39.0–52.0)
Hemoglobin: 10.4 g/dL — ABNORMAL LOW (ref 13.0–17.0)
MCH: 26.1 pg (ref 26.0–34.0)
MCHC: 31.4 g/dL (ref 30.0–36.0)
MCV: 83 fL (ref 80.0–100.0)
Platelets: 195 10*3/uL (ref 150–400)
RBC: 3.99 MIL/uL — ABNORMAL LOW (ref 4.22–5.81)
RDW: 15.9 % — ABNORMAL HIGH (ref 11.5–15.5)
WBC: 5.3 10*3/uL (ref 4.0–10.5)
nRBC: 0 % (ref 0.0–0.2)

## 2023-12-06 LAB — BASIC METABOLIC PANEL
Anion gap: 8 (ref 5–15)
BUN: 17 mg/dL (ref 8–23)
CO2: 28 mmol/L (ref 22–32)
Calcium: 9.5 mg/dL (ref 8.9–10.3)
Chloride: 104 mmol/L (ref 98–111)
Creatinine, Ser: 1.71 mg/dL — ABNORMAL HIGH (ref 0.61–1.24)
GFR, Estimated: 40 mL/min — ABNORMAL LOW (ref >60)
Glucose, Bld: 157 mg/dL — ABNORMAL HIGH (ref 70–99)
Potassium: 3.7 mmol/L (ref 3.5–5.1)
Sodium: 140 mmol/L (ref 135–145)

## 2023-12-06 LAB — TSH: TSH: 1.224 u[IU]/mL (ref 0.350–4.500)

## 2023-12-06 LAB — T4, FREE: Free T4: 1.15 ng/dL — ABNORMAL HIGH (ref 0.61–1.12)

## 2023-12-06 MED ORDER — CARVEDILOL 12.5 MG PO TABS
25.0000 mg | ORAL_TABLET | ORAL | Status: AC
  Administered 2023-12-06: 25 mg via ORAL
  Filled 2023-12-06: qty 2

## 2023-12-06 MED ORDER — LISINOPRIL 10 MG PO TABS
10.0000 mg | ORAL_TABLET | ORAL | Status: AC
  Administered 2023-12-06: 10 mg via ORAL
  Filled 2023-12-06: qty 1

## 2023-12-06 MED ORDER — LACTATED RINGERS IV BOLUS
1000.0000 mL | Freq: Once | INTRAVENOUS | Status: AC
  Administered 2023-12-06: 1000 mL via INTRAVENOUS

## 2023-12-06 MED ORDER — CIPROFLOXACIN HCL 500 MG PO TABS
500.0000 mg | ORAL_TABLET | Freq: Two times a day (BID) | ORAL | 0 refills | Status: AC
Start: 2023-12-06 — End: 2023-12-19
  Filled 2023-12-06: qty 20, 10d supply, fill #0

## 2023-12-06 MED ORDER — CLONIDINE HCL 0.1 MG PO TABS
0.2000 mg | ORAL_TABLET | ORAL | Status: AC
  Administered 2023-12-06: 0.2 mg via ORAL
  Filled 2023-12-06: qty 2

## 2023-12-06 MED ORDER — NIFEDIPINE ER OSMOTIC RELEASE 30 MG PO TB24
90.0000 mg | ORAL_TABLET | ORAL | Status: AC
  Administered 2023-12-06: 90 mg via ORAL
  Filled 2023-12-06: qty 3

## 2023-12-06 NOTE — Discharge Instructions (Signed)
You were seen for your generalized weakness in the emergency department.   At home, please take your blood pressure medication because it was very high in the emergency department.  Take the antibiotics that we prescribed in case you have urinary tract infection.  Check your MyChart online for the results of any tests that had not resulted by the time you left the emergency department.   Follow-up with your primary doctor in 2-3 days regarding your visit.  Please talk to them about having repeat labs drawn to check your kidney function and talk to them about the results of your tacrolimus test.  Return immediately to the emergency department if you experience any of the following: Decreased urination, chest pain, shortness of breath, fevers, or any other concerning symptoms.    Thank you for visiting our Emergency Department. It was a pleasure taking care of you today.

## 2023-12-06 NOTE — ED Provider Notes (Signed)
[facility] EMERGENCY DEPARTMENT AT [facility] Provider Note   CSN: [id number] Arrival date & time: 12/06/23  1246     History {Add pertinent medical, surgical, social history, OB history to HPI:1} Chief Complaint  Patient presents with   Weakness    Cameron [name] is a 77 y.o. male.  77 year old male with a history of kidney transplant in 2014 on tacrolimus and Myfortic, hypertension, diabetes, and thyroid disease who presents to the emergency department with generalized weakness.  Patient reports he has had generalized weakness and fatigue for the past 2 to 3 weeks.  Has been having 3-4 loose stools per day as well.  No fevers.  Occasional chills.  No chest pain, shortness of breath, cough, runny nose or sore throat, urinary retention or dysuria, nausea or vomiting.  Has had decreased p.o. intake recently.  Also feels like he is not himself and has been feeling somewhat down recently as well.  Has been compliant with his medications aside from his antihypertensives this morning.  Had blood drawn at his primary doctors on Tuesday which showed a creatinine of 1.5 for the patient and a hemoglobin of 11.       Home Medications Prior to Admission medications   Medication Sig Start Date End Date Taking? Authorizing Provider  acetaminophen (TYLENOL) 650 MG CR tablet Take 1,300 mg by mouth every 8 (eight) hours as needed for pain.    [provider]  Alcohol Swabs (ALCOHOL PREP) 70 % PADS  02/28/18   [provider]  aspirin EC 325 MG EC tablet Take 1 tablet (325 mg total) by mouth 2 (two) times daily after a meal. 01/06/17   Elodia Florence, PA-C  Blood Glucose Calibration (OT ULTRA/FASTTK CNTRL SOLN) SOLN  10/15/14   [provider]  carvedilol (COREG) 25 MG tablet Take 25 mg by mouth 2 (two) times daily with a meal.    [provider]  CINNAMON PO Take 2,000 mg by mouth daily.    [provider]  cloNIDine (CATAPRES) 0.2 MG tablet Take 0.2  mg by mouth 2 (two) times daily.    [provider]  Continuous Blood Gluc Receiver (DEXCOM G6 RECEIVER) DEVI Use Dexcom Receiver to monitor blood sugar continuously. 07/08/21   Reather Littler, MD  Continuous Glucose Sensor (DEXCOM G6 SENSOR) MISC 1 each by Does not apply route as directed. Change sensor every 10 days 07/24/23   Reather Littler, MD  Continuous Glucose Transmitter (DEXCOM G6 TRANSMITTER) MISC Use one transmitter once every 90 days to transmit blood sugars. 11/09/23   Thapa, [place], MD  doxazosin (CARDURA) 8 MG tablet Take 8 mg by mouth 2 (two) times daily.    [provider]  empagliflozin (JARDIANCE) 25 MG TABS tablet Take 1 tablet (25 mg total) by mouth daily before breakfast. 01/25/23   Reather Littler, MD  glucose blood (ACCU-CHEK GUIDE) test strip Use as instructed to check 1-2X daily 02/07/23   Reather Littler, MD  insulin aspart (NOVOLOG FLEXPEN) 100 UNIT/ML FlexPen USE MAX 60 UNITS PER DAY WITH INSULIN PUMP 08/25/22   Reather Littler, MD  Insulin Disposable Pump (OMNIPOD 5 G6 INTRO, GEN 5,) KIT Use a directed 08/30/23   Shamleffer, Konrad Dolores, MD  Insulin Disposable Pump (OMNIPOD 5 G6 PODS, GEN 5,) MISC 1 Device by Does not apply route every 3 (three) days. 09/29/23   Thapa, [place], MD  Insulin Disposable Pump (OMNIPOD DASH PODS, GEN 4,) MISC APPLY AND CHANGE POD EVERY 3 DAYS AS DIRECTED  TO ADMINISTER INSULIN 07/07/23   Reather Littler, MD  Insulin Disposable Pump (OMNIPOD DASH SYSTEM) KIT 1 each by Does not apply route continuous. Use Omnipod Dash pump to administer insulin.    [provider]  isosorbide-hydrALAZINE (BIDIL) 20-37.5 MG per tablet Take 2 tablets by mouth 3 (three) times daily.    [provider]  K Phos Mono-Sod Phos Di & Mono (PHOSPHA 250 NEUTRAL) [phone] MG TABS Take by mouth.    [provider]  lisinopril (PRINIVIL,ZESTRIL) 10 MG tablet Take 10 mg by mouth daily. 11/23/16   [provider]  magnesium oxide (MAG-OX) 400 (240 Mg)  MG tablet Take 2 tablets by mouth 2 (two) times daily. 04/19/23   [provider]  metFORMIN (GLUCOPHAGE-XR) 500 MG 24 hr tablet TAKE 3 TABLETS AT BEDTIME 05/31/23   Reather Littler, MD  MYFORTIC 180 MG EC tablet Take 360 mg by mouth 2 (two) times daily.     [provider]  NIFEdipine (PROCARDIA XL/ADALAT-CC) 90 MG 24 hr tablet Take 90 mg by mouth daily.     [provider]  NOVOLOG 100 UNIT/ML injection INJECT MAX OF 50 UNITS UNDER THE SKIN PER DAY WITH INSULIN PUMP AS DIRECTED - DISCARD VIAL 28 DAYS AFTER OPENING 01/12/23   Reather Littler, MD  pantoprazole (PROTONIX) 40 MG tablet Take 1 tablet (40 mg total) by mouth 2 (two) times daily. 10/17/22   Horton, Mayer Masker, MD  potassium chloride SA (K-DUR,KLOR-CON) 20 MEQ tablet Take 20 mEq by mouth 4 (four) times daily.     [provider]  PROGRAF 1 MG capsule Take 2 mg by mouth 2 (two) times daily.     [provider]  rosuvastatin (CRESTOR) 20 MG tablet Take 20 mg by mouth every Monday, Wednesday, and Friday.    [provider]  spironolactone (ALDACTONE) 25 MG tablet Take 12.5 mg by mouth daily. 04/06/23   [provider]  tirzepatide Greggory Keen) 5 MG/0.5ML Pen Inject 5 mg into the skin once a week. 07/26/23   Reather Littler, MD  TRAVATAN Z 0.004 % SOLN ophthalmic solution Place 1 drop into both eyes every evening. 10/18/16   [provider]  TURMERIC PO Take 1 tablet by mouth 2 (two) times daily.    [provider]  vitamin E 1000 UNIT capsule Take 1,000 Units by mouth daily.    [provider]      Allergies    Calcitriol and Ergocalciferol    Review of Systems   Review of Systems  Physical Exam Updated Vital Signs BP (!) 170/73 (BP Location: Right Arm)   Pulse 85   Temp 98.3 F (36.8 C)   Resp 16   Ht 5\' 11"  (1.803 m)   Wt 93.4 kg   SpO2 99%   BMI 28.72 kg/m  Physical Exam Vitals and nursing note reviewed.  Constitutional:      General: He is not in  acute distress.    Appearance: He is well-developed.  HENT:     Head: Normocephalic and atraumatic.     Right Ear: External ear normal.     Left Ear: External ear normal.     Nose: Nose normal.  Eyes:     Extraocular Movements: Extraocular movements intact.     Conjunctiva/sclera: Conjunctivae normal.     Pupils: Pupils are equal, round, and reactive to light.  Cardiovascular:     Rate and Rhythm: Normal rate and regular rhythm.     Heart sounds: Normal  heart sounds.  Pulmonary:     Effort: Pulmonary effort is normal. No respiratory distress.     Breath sounds: Normal breath sounds.  Abdominal:     General: There is no distension.     Palpations: Abdomen is soft. There is no mass.     Tenderness: There is no abdominal tenderness. There is no guarding.  Musculoskeletal:     Cervical back: Normal range of motion and neck supple.     Right lower leg: No edema.     Left lower leg: No edema.  Skin:    General: Skin is warm and dry.  Neurological:     Mental Status: He is alert. Mental status is at baseline.  Psychiatric:        Mood and Affect: Mood normal.        Behavior: Behavior normal.     ED Results / Procedures / Treatments   Labs (all labs ordered are listed, but only abnormal results are displayed) Labs Reviewed  BASIC METABOLIC PANEL - Abnormal; Notable for the following components:      Result Value   Glucose, Bld 157 (*)    Creatinine, Ser 1.71 (*)    GFR, Estimated 40 (*)    All other components within normal limits  CBC - Abnormal; Notable for the following components:   RBC 3.99 (*)    Hemoglobin 10.4 (*)    HCT 33.1 (*)    RDW 15.9 (*)    All other components within normal limits  URINALYSIS, ROUTINE W REFLEX MICROSCOPIC - Abnormal; Notable for the following components:   Glucose, UA 500 (*)    Protein, ur 30 (*)    Leukocytes,Ua LARGE (*)    Bacteria, UA RARE (*)    All other components within normal limits  CBG MONITORING, ED    EKG EKG  Interpretation Date/Time:  Wednesday December 06 2023 13:20:32 EST Ventricular Rate:  82 PR Interval:  176 QRS Duration:  98 QT Interval:  352 QTC Calculation: 411 R Axis:   38  Text Interpretation: Normal sinus rhythm Left ventricular hypertrophy with repolarization abnormality ( R in aVL ) Inferior infarct , age undetermined Abnormal ECG No significant change since 12/22/2016 Confirmed by Vonita Moss [phone]) on 12/06/2023 2:53:10 PM  Radiology No results found.  Procedures Procedures  {Document cardiac monitor, telemetry assessment procedure when appropriate:1}  Medications Ordered in ED Medications - No data to display  ED Course/ Medical Decision Making/ A&P   {   Click here for ABCD2, HEART and other calculatorsREFRESH Note before signing :1}                              Medical Decision Making Amount and/or Complexity of Data Reviewed Labs: ordered.  Risk Prescription drug management.   ***  {Document critical care time when appropriate:1} {Document review of labs and clinical decision tools ie heart score, Chads2Vasc2 etc:1}  {Document your independent review of radiology images, and any outside records:1} {Document your discussion with family members, caretakers, and with consultants:1} {Document social determinants of health affecting pt's care:1} {Document your decision making why or why not admission, treatments were needed:1} Final Clinical Impression(s) / ED Diagnoses Final diagnoses:  None    Rx / DC Orders ED Discharge Orders     None

## 2023-12-06 NOTE — ED Notes (Signed)
Pt began taking energy vitality supplement and kachava shake yesterday.

## 2023-12-06 NOTE — ED Triage Notes (Signed)
Pt via pov from home with weakness x 2-3 weeks, states it is getting worse. Pt reports taking a covid test at home yesterday that was negative. Reports diarrhea x 3 weeks as well, states his appetite has not been good - takes mounjaro for type 2 diabetes. Pt alert & oriented, nad noted.

## 2023-12-07 ENCOUNTER — Other Ambulatory Visit (HOSPITAL_BASED_OUTPATIENT_CLINIC_OR_DEPARTMENT_OTHER): Payer: Self-pay

## 2023-12-08 LAB — TACROLIMUS LEVEL: Tacrolimus (FK506) - LabCorp: 7.6 ng/mL (ref 2.0–20.0)

## 2023-12-13 ENCOUNTER — Telehealth: Payer: Self-pay | Admitting: Endocrinology

## 2023-12-13 NOTE — Telephone Encounter (Signed)
MEDICATION: Mounjaro  PHARMACY:  [facility] on [address]  HAS THE PATIENT CONTACTED THEIR PHARMACY?  yes  IS THIS A 90 DAY SUPPLY : no  IS PATIENT OUT OF MEDICATION: yes  IF NOT; HOW MUCH IS LEFT:   LAST APPOINTMENT DATE: @11 /14/2024  NEXT APPOINTMENT DATE:@1 /20/2025  DO WE HAVE YOUR PERMISSION TO LEAVE A DETAILED MESSAGE?: yes  OTHER COMMENTS:    **Let patient know to contact pharmacy at the end of the day to make sure medication is ready. **  ** Please notify patient to allow 48-72 hours to process**  **Encourage patient to contact the pharmacy for refills or they can request refills through [facility]**

## 2023-12-14 ENCOUNTER — Other Ambulatory Visit: Payer: Self-pay

## 2023-12-14 DIAGNOSIS — E1165 Type 2 diabetes mellitus with hyperglycemia: Secondary | ICD-10-CM

## 2023-12-14 MED ORDER — TIRZEPATIDE 5 MG/0.5ML ~~LOC~~ SOAJ: 5.0000 mg | SUBCUTANEOUS | 3 refills | Status: DC

## 2024-01-15 ENCOUNTER — Ambulatory Visit: Payer: Medicare HMO | Admitting: Endocrinology

## 2024-01-15 ENCOUNTER — Encounter: Payer: Self-pay | Admitting: Endocrinology

## 2024-01-15 VITALS — BP 126/80 | HR 64 | Resp 20 | Ht 71.0 in | Wt 186.6 lb

## 2024-01-15 DIAGNOSIS — Z794 Long term (current) use of insulin: Secondary | ICD-10-CM

## 2024-01-15 DIAGNOSIS — E1165 Type 2 diabetes mellitus with hyperglycemia: Secondary | ICD-10-CM

## 2024-01-15 LAB — POCT GLYCOSYLATED HEMOGLOBIN (HGB A1C): Hemoglobin A1C: 6.8 % — AB (ref 4.0–5.6)

## 2024-01-15 MED ORDER — EMPAGLIFLOZIN 25 MG PO TABS: 25.0000 mg | ORAL_TABLET | Freq: Every day | ORAL | 3 refills | Status: AC

## 2024-01-15 MED ORDER — TIRZEPATIDE 5 MG/0.5ML ~~LOC~~ SOAJ: 5.0000 mg | SUBCUTANEOUS | 3 refills | Status: DC

## 2024-01-15 MED ORDER — INSULIN LISPRO 100 UNIT/ML IJ SOLN
INTRAMUSCULAR | 4 refills | Status: DC
Start: 2024-01-15 — End: 2024-01-15

## 2024-01-15 MED ORDER — INSULIN LISPRO 100 UNIT/ML IJ SOLN: INTRAMUSCULAR | 4 refills | Status: DC

## 2024-01-15 MED ORDER — METFORMIN HCL ER 500 MG PO TB24: 500.0000 mg | ORAL_TABLET | Freq: Two times a day (BID) | ORAL | 3 refills | Status: DC

## 2024-01-15 NOTE — Patient Instructions (Addendum)
Decrease metformin to 1 tab two times a day or 2 tabs daily, rest medications same.   Latest Reference Range & Units 07/24/23 16:36 01/15/24 14:51  Hemoglobin A1C 4.0 - 5.6 % 6.9 ! 6.8 !  !: Data is abnormal.
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
## 2024-01-16 ENCOUNTER — Encounter: Payer: Self-pay | Admitting: Endocrinology

## 2024-01-16 LAB — MICROALBUMIN / CREATININE URINE RATIO
Creatinine, Urine: 134 mg/dL (ref 20–320)
Microalb Creat Ratio: 38 mg/g{creat} — ABNORMAL HIGH (ref <30)
Microalb, Ur: 5.1 mg/dL

## 2024-01-17 ENCOUNTER — Encounter (HOSPITAL_COMMUNITY): Payer: Self-pay | Admitting: *Deleted

## 2024-01-19 ENCOUNTER — Other Ambulatory Visit: Payer: Self-pay

## 2024-01-19 DIAGNOSIS — E1165 Type 2 diabetes mellitus with hyperglycemia: Secondary | ICD-10-CM

## 2024-01-19 MED ORDER — TIRZEPATIDE 5 MG/0.5ML ~~LOC~~ SOAJ: 5.0000 mg | SUBCUTANEOUS | 3 refills | Status: DC

## 2024-01-25 ENCOUNTER — Ambulatory Visit: Payer: Medicare HMO | Admitting: Podiatry

## 2024-01-25 ENCOUNTER — Encounter: Payer: Self-pay | Admitting: Podiatry

## 2024-01-25 DIAGNOSIS — B353 Tinea pedis: Secondary | ICD-10-CM

## 2024-01-25 DIAGNOSIS — L84 Corns and callosities: Secondary | ICD-10-CM

## 2024-01-25 DIAGNOSIS — E1142 Type 2 diabetes mellitus with diabetic polyneuropathy: Secondary | ICD-10-CM

## 2024-01-25 DIAGNOSIS — B351 Tinea unguium: Secondary | ICD-10-CM

## 2024-01-25 DIAGNOSIS — M79674 Pain in right toe(s): Secondary | ICD-10-CM

## 2024-01-25 DIAGNOSIS — M79675 Pain in left toe(s): Secondary | ICD-10-CM

## 2024-01-25 MED ORDER — KETOCONAZOLE 2 % EX CREA: 1.0000 | TOPICAL_CREAM | Freq: Every day | CUTANEOUS | 0 refills | Status: DC

## 2024-01-25 NOTE — Progress Notes (Signed)
  Subjective:  Patient ID: Cameron [name], male    DOB: 11/02/1946,  MRN: [id number]  Chief Complaint  Patient presents with   [facility]    NP, Diabetic Foot exam and nail care. He does have a callous on the tip of the right 2nd toe. Wears compression socks. Last A1c was under 7, when he seen his PCP on Jan 20. ASA 23    78 y.o. male presents with the above complaint. History confirmed with patient. Patient presenting with pain related to dystrophic thickened elongated nails. Patient is unable to trim own nails related to nail dystrophy and/or mobility issues. Patient does have a history of T2DM. Patient does have callus present located at the right second toe causing pain.   Objective:  Physical Exam: warm, good capillary refill, diminished pedal hair growth, pedal skin atrophic nail exam onychomycosis of the toenails, onycholysis, and dystrophic nails DP pulses palpable, PT pulses palpable, protective sensation absent, and vibratory sensation diminished Left Foot:  Pain with palpation of nails due to elongation and dystrophic growth.  Right Foot: Pain with palpation of nails due to elongation and dystrophic growth.  Painful hyperkeratotic tissue present distal tuft of right second toe Interdigital maceration present to webspaces bilaterally with localized skin breakdown present to the left second webspace  Assessment:   1. Tinea pedis of left foot   2. Pain due to onychomycosis of toenails of both feet   3. Diabetic polyneuropathy associated with type 2 diabetes mellitus (HCC)   4. Pre-ulcerative calluses      Plan:  Patient was evaluated and treated and all questions answered.  # Tinea pedis -Starting patient on topical ketoconazole cream to be applied once a day -Discussed importance of pedal hygiene, cleaning surrounding environment including bathroom, bath mat, areas or patient frequently goes barefoot.  Utilize clean white socks and use antifungal powder or Lysol for  shoes  #Hyperkeratotic lesions/pre ulcerative calluses present right second toe All symptomatic hyperkeratoses x 1 separate lesions were safely debrided with a sterile #312 blade to patient's level of comfort without incident. We discussed preventative and palliative care of these lesions including supportive and accommodative shoegear, padding, prefabricated and custom molded accommodative orthoses, use of a pumice stone and lotions/creams daily. -Toe cap dispensed for right second toe  #Onychomycosis with pain  -Nails palliatively debrided as below. -Educated on self-care  Procedure: Nail Debridement Rationale: Pain Type of Debridement: manual, sharp debridement. Instrumentation: Nail nipper, rotary burr. Number of Nails: 10  # Diabetes with neuropathy Patient educated on diabetes. Discussed proper diabetic foot care and discussed risks and complications of disease. Educated patient in depth on reasons to return to the office immediately should he/she discover anything concerning or new on the feet. All questions answered. Discussed proper shoes as well.   Return in 3 weeks if interdigital maceration has not improved.  Return in about 3 months (around 04/24/2024) for Diabetic Foot Care.         Bronwen Betters, DPM [facility]

## 2024-02-01 ENCOUNTER — Other Ambulatory Visit (HOSPITAL_COMMUNITY): Payer: Self-pay

## 2024-02-02 ENCOUNTER — Ambulatory Visit (HOSPITAL_COMMUNITY)
Admission: RE | Admit: 2024-02-02 | Discharge: 2024-02-02 | Disposition: A | Discharge status: Home / Self Care | Payer: Medicare HMO | Source: Ambulatory Visit | Attending: Nephrology | Admitting: Nephrology

## 2024-02-02 DIAGNOSIS — D631 Anemia in chronic kidney disease: Secondary | ICD-10-CM | POA: Insufficient documentation

## 2024-02-02 DIAGNOSIS — N189 Chronic kidney disease, unspecified: Secondary | ICD-10-CM | POA: Diagnosis present

## 2024-02-02 MED ORDER — IRON SUCROSE 200 MG IVPB - SIMPLE MED
200.0000 mg | Status: DC
  Administered 2024-02-02: 200 mg via INTRAVENOUS
  Filled 2024-02-02: qty 200

## 2024-02-09 ENCOUNTER — Encounter (HOSPITAL_COMMUNITY)
Admission: RE | Admit: 2024-02-09 | Discharge: 2024-02-09 | Disposition: A | Discharge status: Home / Self Care | Payer: Medicare HMO | Source: Ambulatory Visit | Attending: Nephrology | Admitting: Nephrology

## 2024-02-09 DIAGNOSIS — D631 Anemia in chronic kidney disease: Secondary | ICD-10-CM | POA: Diagnosis present

## 2024-02-09 DIAGNOSIS — N183 Chronic kidney disease, stage 3 unspecified: Secondary | ICD-10-CM | POA: Diagnosis present

## 2024-02-09 MED ORDER — IRON SUCROSE 200 MG IVPB - SIMPLE MED
200.0000 mg | Status: DC
  Administered 2024-02-09: 200 mg via INTRAVENOUS
  Filled 2024-02-09: qty 200

## 2024-02-15 ENCOUNTER — Other Ambulatory Visit (HOSPITAL_COMMUNITY): Payer: Self-pay | Admitting: *Deleted

## 2024-02-16 ENCOUNTER — Encounter (HOSPITAL_COMMUNITY): Payer: Medicare HMO

## 2024-02-19 ENCOUNTER — Encounter (HOSPITAL_COMMUNITY)
Admission: RE | Admit: 2024-02-19 | Discharge: 2024-02-19 | Disposition: A | Discharge status: Home / Self Care | Payer: Medicare HMO | Source: Ambulatory Visit | Attending: Nephrology

## 2024-02-19 DIAGNOSIS — N183 Chronic kidney disease, stage 3 unspecified: Secondary | ICD-10-CM | POA: Diagnosis not present

## 2024-02-19 MED ORDER — IRON SUCROSE 200 MG IVPB - SIMPLE MED
200.0000 mg | Status: DC
  Administered 2024-02-19: 200 mg via INTRAVENOUS
  Filled 2024-02-19: qty 200

## 2024-02-26 ENCOUNTER — Encounter (HOSPITAL_COMMUNITY)
Admission: RE | Admit: 2024-02-26 | Discharge: 2024-02-26 | Disposition: A | Discharge status: Home / Self Care | Payer: Medicare HMO | Source: Ambulatory Visit | Attending: Nephrology | Admitting: Nephrology

## 2024-02-26 DIAGNOSIS — N183 Chronic kidney disease, stage 3 unspecified: Secondary | ICD-10-CM | POA: Diagnosis present

## 2024-02-26 DIAGNOSIS — D631 Anemia in chronic kidney disease: Secondary | ICD-10-CM | POA: Diagnosis present

## 2024-02-26 MED ORDER — IRON SUCROSE 200 MG IVPB - SIMPLE MED
200.0000 mg | Status: DC
  Administered 2024-02-26: 200 mg via INTRAVENOUS
  Filled 2024-02-26: qty 200

## 2024-03-04 ENCOUNTER — Encounter (HOSPITAL_COMMUNITY): Payer: Medicare HMO

## 2024-03-08 ENCOUNTER — Encounter (HOSPITAL_COMMUNITY)
Admission: RE | Admit: 2024-03-08 | Discharge: 2024-03-08 | Disposition: A | Discharge status: Home / Self Care | Source: Ambulatory Visit | Attending: Nephrology | Admitting: Nephrology

## 2024-03-08 DIAGNOSIS — N183 Chronic kidney disease, stage 3 unspecified: Secondary | ICD-10-CM | POA: Diagnosis not present

## 2024-03-08 MED ORDER — IRON SUCROSE 200 MG IVPB - SIMPLE MED
200.0000 mg | Status: DC
  Administered 2024-03-08: 200 mg via INTRAVENOUS
  Filled 2024-03-08: qty 200

## 2024-04-05 ENCOUNTER — Other Ambulatory Visit: Payer: Self-pay | Admitting: Podiatry

## 2024-04-05 DIAGNOSIS — B353 Tinea pedis: Secondary | ICD-10-CM

## 2024-04-09 ENCOUNTER — Ambulatory Visit: Admitting: Endocrinology

## 2024-04-09 ENCOUNTER — Encounter: Payer: Self-pay | Admitting: Endocrinology

## 2024-04-09 VITALS — BP 100/80 | HR 67 | Ht 71.0 in | Wt 186.0 lb

## 2024-04-09 DIAGNOSIS — Z794 Long term (current) use of insulin: Secondary | ICD-10-CM

## 2024-04-09 DIAGNOSIS — E1165 Type 2 diabetes mellitus with hyperglycemia: Secondary | ICD-10-CM | POA: Diagnosis not present

## 2024-04-09 LAB — POCT GLYCOSYLATED HEMOGLOBIN (HGB A1C): Hemoglobin A1C: 6.9 % — AB (ref 4.0–5.6)
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
## 2024-04-17 ENCOUNTER — Ambulatory Visit: Payer: Medicare HMO | Admitting: Endocrinology

## 2024-04-22 ENCOUNTER — Emergency Department (HOSPITAL_BASED_OUTPATIENT_CLINIC_OR_DEPARTMENT_OTHER): Admitting: Radiology

## 2024-04-22 ENCOUNTER — Encounter (HOSPITAL_BASED_OUTPATIENT_CLINIC_OR_DEPARTMENT_OTHER): Payer: Self-pay

## 2024-04-22 ENCOUNTER — Emergency Department (HOSPITAL_BASED_OUTPATIENT_CLINIC_OR_DEPARTMENT_OTHER)
Admission: EM | Admit: 2024-04-22 | Discharge: 2024-04-22 | Disposition: A | Discharge status: Home / Self Care | Attending: Emergency Medicine | Admitting: Emergency Medicine

## 2024-04-22 ENCOUNTER — Other Ambulatory Visit: Payer: Self-pay

## 2024-04-22 DIAGNOSIS — I509 Heart failure, unspecified: Secondary | ICD-10-CM | POA: Insufficient documentation

## 2024-04-22 DIAGNOSIS — N189 Chronic kidney disease, unspecified: Secondary | ICD-10-CM | POA: Diagnosis not present

## 2024-04-22 DIAGNOSIS — I130 Hypertensive heart and chronic kidney disease with heart failure and stage 1 through stage 4 chronic kidney disease, or unspecified chronic kidney disease: Secondary | ICD-10-CM | POA: Insufficient documentation

## 2024-04-22 DIAGNOSIS — E1122 Type 2 diabetes mellitus with diabetic chronic kidney disease: Secondary | ICD-10-CM | POA: Insufficient documentation

## 2024-04-22 DIAGNOSIS — Z940 Kidney transplant status: Secondary | ICD-10-CM | POA: Insufficient documentation

## 2024-04-22 DIAGNOSIS — R0781 Pleurodynia: Secondary | ICD-10-CM | POA: Diagnosis not present

## 2024-04-22 DIAGNOSIS — Z79899 Other long term (current) drug therapy: Secondary | ICD-10-CM | POA: Insufficient documentation

## 2024-04-22 DIAGNOSIS — Z794 Long term (current) use of insulin: Secondary | ICD-10-CM | POA: Diagnosis not present

## 2024-04-22 DIAGNOSIS — M25512 Pain in left shoulder: Secondary | ICD-10-CM | POA: Insufficient documentation

## 2024-04-22 DIAGNOSIS — Z7982 Long term (current) use of aspirin: Secondary | ICD-10-CM | POA: Diagnosis not present

## 2024-04-22 DIAGNOSIS — W19XXXA Unspecified fall, initial encounter: Secondary | ICD-10-CM

## 2024-04-22 NOTE — ED Triage Notes (Signed)
 Pt c/o mechanical fall Wednesday, c/o L shoulder, rib pain. Seen at [facility] for same, advised that he might "need an US  instead bc there's nothing obvious."

## 2024-04-22 NOTE — ED Provider Notes (Signed)
 Care of patient received from prior provider at 6:42 PM, please see their note for complete H/P and care plan.  Received handoff per ED course.  Clinical Course as of 04/22/24 1842  Mon Apr 22, 2024  1841 Stable  Mech fall  [CC]    Clinical Course User Index [CC] Onetha Bile, MD    Reassessment: X-rays nondiagnostic for any acute pathology.  Patient ambulatory tolerating p.o. intake in no acute distress. Will have him follow-up in outpatient setting.  Supportive care as needed.     Onetha Bile, MD 04/22/24 [phone]

## 2024-04-22 NOTE — ED Notes (Signed)
 Called radiology about xrays. Reports they are coming to get him now.
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
## 2024-05-23 ENCOUNTER — Encounter: Payer: Self-pay | Admitting: Podiatry

## 2024-05-23 ENCOUNTER — Ambulatory Visit (INDEPENDENT_AMBULATORY_CARE_PROVIDER_SITE_OTHER): Admitting: Podiatry

## 2024-05-23 DIAGNOSIS — B351 Tinea unguium: Secondary | ICD-10-CM

## 2024-05-23 DIAGNOSIS — E1142 Type 2 diabetes mellitus with diabetic polyneuropathy: Secondary | ICD-10-CM

## 2024-05-23 DIAGNOSIS — M79674 Pain in right toe(s): Secondary | ICD-10-CM

## 2024-05-23 DIAGNOSIS — M79675 Pain in left toe(s): Secondary | ICD-10-CM

## 2024-05-23 NOTE — Progress Notes (Signed)
This patient returns to my office for at risk foot care.  This patient requires this care by a professional since this patient will be at risk due to having diabetic neuropathy.  This patient is unable to cut nails himself since the patient cannot reach his nails.These nails are painful walking and wearing shoes.  This patient presents for at risk foot care today.  General Appearance  Alert, conversant and in no acute stress.  Vascular  Dorsalis pedis and posterior tibial  pulses are palpable  bilaterally.  Capillary return is within normal limits  bilaterally. Temperature is within normal limits  bilaterally.  Neurologic  Senn-Weinstein monofilament wire test within normal limits /diminished bilaterally. Muscle power within normal limits bilaterally.  Nails Thick disfigured discolored nails with subungual debris  from hallux to fifth toes bilaterally. No evidence of bacterial infection or drainage bilaterally.  Orthopedic  No limitations of motion  feet .  No crepitus or effusions noted.  No bony pathology or digital deformities noted.  Skin  normotropic skin with no porokeratosis noted bilaterally.  No signs of infections or ulcers noted.     Onychomycosis  Pain in right toes  Pain in left toes  Consent was obtained for treatment procedures.   Mechanical debridement of nails 1-5  bilaterally performed with a nail nipper.  Filed with dremel without incident.    Return office visit   3 months                  Told patient to return for periodic foot care and evaluation due to potential at risk complications.   Gardiner Barefoot DPM

## 2024-06-04 ENCOUNTER — Other Ambulatory Visit: Payer: Self-pay | Admitting: Podiatry

## 2024-06-04 DIAGNOSIS — B353 Tinea pedis: Secondary | ICD-10-CM

## 2024-06-11 ENCOUNTER — Other Ambulatory Visit: Payer: Self-pay | Admitting: Endocrinology

## 2024-06-11 DIAGNOSIS — E1165 Type 2 diabetes mellitus with hyperglycemia: Secondary | ICD-10-CM

## 2024-07-10 ENCOUNTER — Other Ambulatory Visit: Payer: Self-pay | Admitting: Podiatry

## 2024-07-10 DIAGNOSIS — B353 Tinea pedis: Secondary | ICD-10-CM

## 2024-07-23 ENCOUNTER — Other Ambulatory Visit: Payer: Self-pay | Admitting: Podiatry

## 2024-07-23 DIAGNOSIS — B353 Tinea pedis: Secondary | ICD-10-CM

## 2024-07-25 ENCOUNTER — Other Ambulatory Visit: Payer: Self-pay

## 2024-07-25 ENCOUNTER — Telehealth: Payer: Self-pay | Admitting: Endocrinology

## 2024-07-25 DIAGNOSIS — E1165 Type 2 diabetes mellitus with hyperglycemia: Secondary | ICD-10-CM

## 2024-07-25 MED ORDER — DEXCOM G6 SENSOR MISC: 1.0000 | 45 refills | Status: AC

## 2024-07-25 NOTE — Telephone Encounter (Signed)
 MEDICATION: Dexcom G6 Sensor  PHARMACY:  [facility] on [address]   HAS THE PATIENT CONTACTED THEIR PHARMACY?  YES  IS THIS A 90 DAY SUPPLY : YES  IS PATIENT OUT OF MEDICATION: YES  IF NOT; HOW MUCH IS LEFT:  NONE  LAST APPOINTMENT DATE: @6 /17/2025  NEXT APPOINTMENT DATE:@8 /05/2024  DO WE HAVE YOUR PERMISSION TO LEAVE A DETAILED MESSAGE?:  OTHER COMMENTS:    **Let patient know to contact pharmacy at the end of the day to make sure medication is ready. **  ** Please notify patient to allow 48-72 hours to process**  **Encourage patient to contact the pharmacy for refills or they can request refills through [facility]**

## 2024-07-31 ENCOUNTER — Ambulatory Visit: Admitting: Endocrinology

## 2024-08-13 ENCOUNTER — Ambulatory Visit: Admitting: Endocrinology

## 2024-08-16 ENCOUNTER — Encounter: Attending: Cardiology | Admitting: Dietician

## 2024-08-16 ENCOUNTER — Ambulatory Visit: Admitting: Endocrinology

## 2024-08-16 ENCOUNTER — Encounter: Payer: Self-pay | Admitting: Endocrinology

## 2024-08-16 ENCOUNTER — Other Ambulatory Visit: Payer: Self-pay | Admitting: Endocrinology

## 2024-08-16 VITALS — BP 136/60 | HR 74 | Resp 20 | Ht 71.0 in | Wt 184.4 lb

## 2024-08-16 DIAGNOSIS — Z794 Long term (current) use of insulin: Secondary | ICD-10-CM

## 2024-08-16 DIAGNOSIS — E118 Type 2 diabetes mellitus with unspecified complications: Secondary | ICD-10-CM | POA: Insufficient documentation

## 2024-08-16 DIAGNOSIS — E1165 Type 2 diabetes mellitus with hyperglycemia: Secondary | ICD-10-CM

## 2024-08-16 MED ORDER — DEXCOM G7 SENSOR MISC: 1.0000 | 3 refills | Status: AC

## 2024-08-16 NOTE — Progress Notes (Addendum)
 Patient is here today to get his new Omnipod 5 PDM programed as his old one was replaced. The following settings were programmed into his new PDM: Basal ( 23.2 units/day) 12 am - 8 am:  0.55u/hour 8 am -12 pm:  1.05  12 pm - 4 pm:  1.2 4 pm - 9 pm:  1.3 9 pm - 12 am:  1.1 Max basal 2.4 units/hr ICR:  1:1 as patient does not carb count.   Generally using 3.5 - 10 carb however he is just based on meal size and blood sugar. Target:  120 Correct above:  120 Correction/Sensitivity:  1:25 Reverse correction:  on Extended bolus:  on Duration of insulin  action:  4 hours Max bolus 14 units  Patient filled his pod with 150 units of insulin , applied it at started the POD.  The canula is in proper position.  Patient should monitor his blood glucose closely for 2 hours. He is to call us  for questions.  Omnipod 5 ID:  [email or web address] Password:  [email or web address]   Leita Constable, RD, LDN, CDCES, DipACLM

## 2024-08-16 NOTE — Addendum Note (Signed)
 Addended by: Mattew Chriswell, IRA[place] on: 08/16/2024 11:21 AM   Modules accepted: Orders
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
## 2024-08-20 ENCOUNTER — Ambulatory Visit: Admitting: Nutrition

## 2024-08-22 ENCOUNTER — Encounter: Payer: Self-pay | Admitting: Podiatry

## 2024-08-22 ENCOUNTER — Ambulatory Visit (INDEPENDENT_AMBULATORY_CARE_PROVIDER_SITE_OTHER): Admitting: Podiatry

## 2024-08-22 DIAGNOSIS — B351 Tinea unguium: Secondary | ICD-10-CM

## 2024-08-22 DIAGNOSIS — M79674 Pain in right toe(s): Secondary | ICD-10-CM

## 2024-08-22 DIAGNOSIS — E1142 Type 2 diabetes mellitus with diabetic polyneuropathy: Secondary | ICD-10-CM | POA: Diagnosis not present

## 2024-08-22 DIAGNOSIS — M79675 Pain in left toe(s): Secondary | ICD-10-CM

## 2024-08-22 NOTE — Progress Notes (Signed)
This patient returns to my office for at risk foot care.  This patient requires this care by a professional since this patient will be at risk due to having diabetic neuropathy.  This patient is unable to cut nails himself since the patient cannot reach his nails.These nails are painful walking and wearing shoes.  This patient presents for at risk foot care today.  General Appearance  Alert, conversant and in no acute stress.  Vascular  Dorsalis pedis and posterior tibial  pulses are palpable  bilaterally.  Capillary return is within normal limits  bilaterally. Temperature is within normal limits  bilaterally.  Neurologic  Senn-Weinstein monofilament wire test within normal limits /diminished bilaterally. Muscle power within normal limits bilaterally.  Nails Thick disfigured discolored nails with subungual debris  from hallux to fifth toes bilaterally. No evidence of bacterial infection or drainage bilaterally.  Orthopedic  No limitations of motion  feet .  No crepitus or effusions noted.  No bony pathology or digital deformities noted.  Skin  normotropic skin with no porokeratosis noted bilaterally.  No signs of infections or ulcers noted.     Onychomycosis  Pain in right toes  Pain in left toes  Consent was obtained for treatment procedures.   Mechanical debridement of nails 1-5  bilaterally performed with a nail nipper.  Filed with dremel without incident.    Return office visit   3 months                  Told patient to return for periodic foot care and evaluation due to potential at risk complications.   Gardiner Barefoot DPM

## 2024-09-02 ENCOUNTER — Other Ambulatory Visit: Payer: Self-pay

## 2024-09-02 DIAGNOSIS — E1165 Type 2 diabetes mellitus with hyperglycemia: Secondary | ICD-10-CM

## 2024-09-02 MED ORDER — METFORMIN HCL ER 500 MG PO TB24: ORAL_TABLET | ORAL | 3 refills | Status: AC

## 2024-09-02 NOTE — Telephone Encounter (Signed)
 Prescription sent per [facility] request

## 2024-09-03 ENCOUNTER — Telehealth: Payer: Self-pay

## 2024-09-03 NOTE — Telephone Encounter (Signed)
 Patient stated that he has been high reading since being put on the Dexcom G7 last week. Patient feels that the change must have increased his blood sugars, RN explained to patient that the sensors only checks the blood glucose. Patient states his blood glucose was 300 yesterday and he double checked by pricking his finger. Currently while speaking to patient its at 155 however patient stated he has not eaten today and yesterday his fasting was 180. Patient would like MD to make adjustments to his med regimen.

## 2024-09-04 NOTE — Telephone Encounter (Signed)
 I would think high blood sugar unlikely to be related to change in Dexcom G7 with insulin  pump.  I would recommend to stay on the sensor for 1-2 weeks, if he continued to have a concerning or high blood sugar asked to call our clinic.  If the blood sugar is still high at that time we will increase the dose of Mounjaro .

## 2024-09-23 ENCOUNTER — Other Ambulatory Visit: Payer: Self-pay | Admitting: Internal Medicine

## 2024-09-23 DIAGNOSIS — E1165 Type 2 diabetes mellitus with hyperglycemia: Secondary | ICD-10-CM

## 2024-09-30 ENCOUNTER — Other Ambulatory Visit: Payer: Self-pay | Admitting: Nephrology

## 2024-09-30 DIAGNOSIS — Z940 Kidney transplant status: Secondary | ICD-10-CM

## 2024-10-10 ENCOUNTER — Other Ambulatory Visit

## 2024-10-14 ENCOUNTER — Ambulatory Visit
Admission: RE | Admit: 2024-10-14 | Discharge: 2024-10-14 | Disposition: A | Discharge status: Home / Self Care | Source: Ambulatory Visit | Attending: Nephrology | Admitting: Nephrology

## 2024-10-14 DIAGNOSIS — Z940 Kidney transplant status: Secondary | ICD-10-CM

## 2024-10-16 ENCOUNTER — Other Ambulatory Visit

## 2024-10-24 ENCOUNTER — Other Ambulatory Visit: Payer: Self-pay | Admitting: Podiatry

## 2024-10-24 DIAGNOSIS — B353 Tinea pedis: Secondary | ICD-10-CM

## 2024-11-18 ENCOUNTER — Ambulatory Visit: Payer: Self-pay | Admitting: Endocrinology

## 2024-11-18 ENCOUNTER — Encounter: Payer: Self-pay | Admitting: Endocrinology

## 2024-11-18 ENCOUNTER — Ambulatory Visit: Admitting: Endocrinology

## 2024-11-18 ENCOUNTER — Other Ambulatory Visit

## 2024-11-18 VITALS — BP 138/62 | HR 83 | Resp 16 | Ht 71.0 in | Wt 183.4 lb

## 2024-11-18 DIAGNOSIS — Z794 Long term (current) use of insulin: Secondary | ICD-10-CM

## 2024-11-18 DIAGNOSIS — E1165 Type 2 diabetes mellitus with hyperglycemia: Secondary | ICD-10-CM | POA: Diagnosis not present

## 2024-11-18 LAB — POCT GLYCOSYLATED HEMOGLOBIN (HGB A1C): Hemoglobin A1C: 6.6 % — AB (ref 4.0–5.6)
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
## 2024-11-19 LAB — BASIC METABOLIC PANEL WITH GFR
BUN/Creatinine Ratio: 11 (calc) (ref 6–22)
BUN: 19 mg/dL (ref 7–25)
CO2: 29 mmol/L (ref 20–32)
Calcium: 9.5 mg/dL (ref 8.6–10.3)
Chloride: 101 mmol/L (ref 98–110)
Creat: 1.66 mg/dL — ABNORMAL HIGH (ref 0.70–1.22)
Glucose, Bld: 100 mg/dL — ABNORMAL HIGH (ref 65–99)
Potassium: 4.1 mmol/L (ref 3.5–5.3)
Sodium: 138 mmol/L (ref 135–146)
eGFR: 41 mL/min/1.73m2 — ABNORMAL LOW (ref >=60)

## 2024-11-19 LAB — MICROALBUMIN / CREATININE URINE RATIO
Creatinine, Urine: 229 mg/dL (ref 20–320)
Microalb Creat Ratio: 38 mg/g{creat} — ABNORMAL HIGH (ref <30)
Microalb, Ur: 8.8 mg/dL

## 2024-11-25 ENCOUNTER — Encounter: Payer: Self-pay | Admitting: Podiatry

## 2024-11-25 ENCOUNTER — Ambulatory Visit (INDEPENDENT_AMBULATORY_CARE_PROVIDER_SITE_OTHER): Admitting: Podiatry

## 2024-11-25 DIAGNOSIS — B351 Tinea unguium: Secondary | ICD-10-CM

## 2024-11-25 DIAGNOSIS — E1142 Type 2 diabetes mellitus with diabetic polyneuropathy: Secondary | ICD-10-CM | POA: Diagnosis not present

## 2024-11-25 DIAGNOSIS — M79675 Pain in left toe(s): Secondary | ICD-10-CM

## 2024-11-25 DIAGNOSIS — M79674 Pain in right toe(s): Secondary | ICD-10-CM | POA: Diagnosis not present

## 2024-11-25 NOTE — Progress Notes (Signed)
 This patient returns to my office for at risk foot care.  This patient requires this care by a professional since this patient will be at risk due to having diabetic neuropathy. This patient is unable to cut nails himself since the patient cannot reach his nails.These nails are painful walking and wearing shoes.  This patient presents for at risk foot care today.  General Appearance  Alert, conversant and in no acute stress.  Vascular  Dorsalis pedis and posterior tibial  pulses are palpable  bilaterally.  Capillary return is within normal limits  bilaterally. Temperature is within normal limits  bilaterally.  Neurologic  Senn-Weinstein monofilament wire test within normal limits/diminished   bilaterally. Muscle power within normal limits bilaterally.  Nails Thick disfigured discolored nails with subungual debris  from hallux to fifth toes bilaterally. No evidence of bacterial infection or drainage bilaterally.  Orthopedic  No limitations of motion  feet .  No crepitus or effusions noted.  No bony pathology or digital deformities noted.  Skin  normotropic skin with no porokeratosis noted bilaterally.  No signs of infections or ulcers noted.   Distal clavi second toe right foot.  Onychomycosis  Pain in right toes  Pain in left toes  Consent was obtained for treatment procedures.   Mechanical debridement of nails 1-5  bilaterally performed with a nail nipper.  Filed with dremel without incident. Debride clavi as a courtesy.   Return office visit    3 months                  Told patient to return for periodic foot care and evaluation due to potential at risk complications.   Cordella Bold DPM

## 2025-01-16 ENCOUNTER — Other Ambulatory Visit: Payer: Self-pay | Admitting: Endocrinology

## 2025-01-16 DIAGNOSIS — E1165 Type 2 diabetes mellitus with hyperglycemia: Secondary | ICD-10-CM

## 2025-02-20 ENCOUNTER — Ambulatory Visit: Admitting: Endocrinology

## 2025-02-24 ENCOUNTER — Ambulatory Visit: Admitting: Podiatry
